# Patient Record
Sex: Male | Born: 1970 | Race: White | Hispanic: No | Marital: Married | Smoking: Never smoker
Health system: Southern US, Community
[De-identification: ages and names within clinical notes are randomized; demographics above are authoritative.]

## PROBLEM LIST (undated history)

## (undated) DIAGNOSIS — E7800 Pure hypercholesterolemia, unspecified: Secondary | ICD-10-CM

---

## 2014-08-15 ENCOUNTER — Encounter (HOSPITAL_COMMUNITY): Payer: Self-pay | Admitting: *Deleted

## 2014-08-15 ENCOUNTER — Inpatient Hospital Stay (HOSPITAL_COMMUNITY): Payer: BLUE CROSS/BLUE SHIELD

## 2014-08-15 ENCOUNTER — Inpatient Hospital Stay (HOSPITAL_COMMUNITY)
Admission: EM | Admit: 2014-08-15 | Discharge: 2014-08-18 | DRG: 603 | Disposition: A | Discharge status: Home / Self Care | Payer: BLUE CROSS/BLUE SHIELD | Attending: Internal Medicine | Admitting: Internal Medicine

## 2014-08-15 DIAGNOSIS — W57XXXA Bitten or stung by nonvenomous insect and other nonvenomous arthropods, initial encounter: Secondary | ICD-10-CM | POA: Diagnosis present

## 2014-08-15 DIAGNOSIS — Z833 Family history of diabetes mellitus: Secondary | ICD-10-CM | POA: Diagnosis not present

## 2014-08-15 DIAGNOSIS — Z791 Long term (current) use of non-steroidal anti-inflammatories (NSAID): Secondary | ICD-10-CM | POA: Diagnosis not present

## 2014-08-15 DIAGNOSIS — L02419 Cutaneous abscess of limb, unspecified: Secondary | ICD-10-CM | POA: Diagnosis present

## 2014-08-15 DIAGNOSIS — L03119 Cellulitis of unspecified part of limb: Secondary | ICD-10-CM | POA: Diagnosis present

## 2014-08-15 DIAGNOSIS — Z23 Encounter for immunization: Secondary | ICD-10-CM

## 2014-08-15 DIAGNOSIS — Z79899 Other long term (current) drug therapy: Secondary | ICD-10-CM

## 2014-08-15 DIAGNOSIS — L0291 Cutaneous abscess, unspecified: Secondary | ICD-10-CM | POA: Insufficient documentation

## 2014-08-15 DIAGNOSIS — L0390 Cellulitis, unspecified: Secondary | ICD-10-CM

## 2014-08-15 DIAGNOSIS — L03115 Cellulitis of right lower limb: Secondary | ICD-10-CM | POA: Diagnosis present

## 2014-08-15 DIAGNOSIS — M7989 Other specified soft tissue disorders: Secondary | ICD-10-CM | POA: Diagnosis present

## 2014-08-15 DIAGNOSIS — L02415 Cutaneous abscess of right lower limb: Secondary | ICD-10-CM | POA: Diagnosis present

## 2014-08-15 LAB — CBC WITH DIFFERENTIAL/PLATELET
Basophils Absolute: 0 10*3/uL (ref 0.0–0.1)
Basophils Relative: 0 % (ref 0–1)
EOS ABS: 0.1 10*3/uL (ref 0.0–0.7)
Eosinophils Relative: 1 % (ref 0–5)
HEMATOCRIT: 45.5 % (ref 39.0–52.0)
HEMOGLOBIN: 14.9 g/dL (ref 13.0–17.0)
LYMPHS ABS: 3 10*3/uL (ref 0.7–4.0)
Lymphocytes Relative: 22 % (ref 12–46)
MCH: 29.9 pg (ref 26.0–34.0)
MCHC: 32.7 g/dL (ref 30.0–36.0)
MCV: 91.2 fL (ref 78.0–100.0)
MONOS PCT: 11 % (ref 3–12)
Monocytes Absolute: 1.5 10*3/uL — ABNORMAL HIGH (ref 0.1–1.0)
NEUTROS PCT: 66 % (ref 43–77)
Neutro Abs: 8.8 10*3/uL — ABNORMAL HIGH (ref 1.7–7.7)
Platelets: 288 10*3/uL (ref 150–400)
RBC: 4.99 MIL/uL (ref 4.22–5.81)
RDW: 13.1 % (ref 11.5–15.5)
WBC: 13.4 10*3/uL — ABNORMAL HIGH (ref 4.0–10.5)

## 2014-08-15 LAB — BASIC METABOLIC PANEL
Anion gap: 8 (ref 5–15)
BUN: 16 mg/dL (ref 6–20)
CO2: 32 mmol/L (ref 22–32)
CREATININE: 1.16 mg/dL (ref 0.61–1.24)
Calcium: 9.6 mg/dL (ref 8.9–10.3)
Chloride: 98 mmol/L — ABNORMAL LOW (ref 101–111)
GFR calc Af Amer: >60 mL/min (ref >60)
GFR calc non Af Amer: >60 mL/min (ref >60)
GLUCOSE: 106 mg/dL — AB (ref 65–99)
Potassium: 3.9 mmol/L (ref 3.5–5.1)
Sodium: 138 mmol/L (ref 135–145)

## 2014-08-15 MED ORDER — ENOXAPARIN SODIUM 40 MG/0.4ML ~~LOC~~ SOLN
40.0000 mg | SUBCUTANEOUS | Status: DC
  Administered 2014-08-15 – 2014-08-17 (×3): 40 mg via SUBCUTANEOUS
  Filled 2014-08-15 (×4): qty 0.4

## 2014-08-15 MED ORDER — ONDANSETRON HCL 4 MG/2ML IJ SOLN: 4.0000 mg | Freq: Three times a day (TID) | INTRAMUSCULAR | Status: DC | PRN

## 2014-08-15 MED ORDER — VANCOMYCIN HCL IN DEXTROSE 1-5 GM/200ML-% IV SOLN
1000.0000 mg | Freq: Three times a day (TID) | INTRAVENOUS | Status: DC
  Administered 2014-08-16 – 2014-08-18 (×7): 1000 mg via INTRAVENOUS
  Filled 2014-08-15 (×9): qty 200

## 2014-08-15 MED ORDER — ACETAMINOPHEN 325 MG PO TABS
650.0000 mg | ORAL_TABLET | Freq: Four times a day (QID) | ORAL | Status: DC | PRN
  Administered 2014-08-15: 650 mg via ORAL
  Filled 2014-08-15: qty 2

## 2014-08-15 MED ORDER — MORPHINE SULFATE 2 MG/ML IJ SOLN: 2.0000 mg | INTRAMUSCULAR | Status: DC | PRN

## 2014-08-15 MED ORDER — SODIUM CHLORIDE 0.9 % IV SOLN
INTRAVENOUS | Status: DC
  Administered 2014-08-15: 1000 mL via INTRAVENOUS

## 2014-08-15 MED ORDER — VANCOMYCIN HCL IN DEXTROSE 1-5 GM/200ML-% IV SOLN
1000.0000 mg | Freq: Once | INTRAVENOUS | Status: AC
  Administered 2014-08-15: 1000 mg via INTRAVENOUS
  Filled 2014-08-15: qty 200

## 2014-08-15 MED ORDER — LIDOCAINE HCL 2 % IJ SOLN
10.0000 mL | Freq: Once | INTRAMUSCULAR | Status: AC
  Administered 2014-08-15: 200 mg
  Filled 2014-08-15: qty 20

## 2014-08-15 MED ORDER — ACETAMINOPHEN 650 MG RE SUPP: 650.0000 mg | Freq: Four times a day (QID) | RECTAL | Status: DC | PRN

## 2014-08-15 MED ORDER — ONDANSETRON HCL 4 MG/2ML IJ SOLN: 4.0000 mg | Freq: Four times a day (QID) | INTRAMUSCULAR | Status: DC | PRN

## 2014-08-15 MED ORDER — HYDROMORPHONE HCL 1 MG/ML IJ SOLN: 1.0000 mg | INTRAMUSCULAR | Status: DC | PRN

## 2014-08-15 MED ORDER — ONDANSETRON HCL 4 MG PO TABS: 4.0000 mg | ORAL_TABLET | Freq: Four times a day (QID) | ORAL | Status: DC | PRN

## 2014-08-15 MED ORDER — HYDROCODONE-ACETAMINOPHEN 5-325 MG PO TABS: 1.0000 | ORAL_TABLET | ORAL | Status: DC | PRN

## 2014-08-15 NOTE — ED Notes (Signed)
Per pt report: pt was bitten by an unknown insect 3 days ago.  Initially the site was a "big whitehead" that the pt popped and attempted to keep the site clean.  Pt reports the site has been getting worse. Site is red, swollen, and painful to the touch.  Pt was a urgent care yesterday and was given an IM shot of Ancef and a prescription for bactrim. Pt repors having a fever and a headache today.

## 2014-08-15 NOTE — ED Notes (Signed)
Pt's insect bite is on the right calve. Pt reports increased pain when ambulating.

## 2014-08-15 NOTE — H&P (Signed)
PCP:  LEE, DAVID Bea Laura, MD not local   Referring provider JOsh   Chief Complaint:  Right leg swelling  HPI: Ethan Matthews is a 44 y.o. male   has no past medical history on file.   Presented with  5-6 days ago he developed a "white head" on the side of his right calf. He used alcohol and tried to pop it. Next day it returned 3 times the size and started to swell. Yesterday patient presented to urgent care and was started on bactrim. He was given Rocephin IM and given prescription for bactrim. He took 3 doses but developed worsening leg swelling erythema and fever up to 100.5 HE presented to ER and was found to have an abscess. Patient had I&D and packing and was given a dose of Vancomycin IV. WBC was up to 13.4 Of note patient drove to [facility] with some breaks. Denies any chest pain or shortness of breath.   Hospitalist was called for admission for Cellulitis and abscess of right leg   Review of Systems:    Pertinent positives include: Fevers, chills, headaches,  nausea,   Constitutional:  No weight loss, night sweats,  fatigue, weight loss  HEENT:  No , Difficulty swallowing,Tooth/dental problems,Sore throat,  No sneezing, itching, ear ache, nasal congestion, post nasal drip,  Cardio-vascular:  No chest pain, Orthopnea, PND, anasarca, dizziness, palpitations.no Bilateral lower extremity swelling  GI:  No heartburn, indigestion, abdominal pain, vomiting, diarrhea, change in bowel habits, loss of appetite, melena, blood in stool, hematemesis Resp:  no shortness of breath at rest. No dyspnea on exertion, No excess mucus, no productive cough, No non-productive cough, No coughing up of blood.No change in color of mucus.No wheezing. Skin:  no rash or lesions. No jaundice GU:  no dysuria, change in color of urine, no urgency or frequency. No straining to urinate.  No flank pain.  Musculoskeletal:  No joint pain or no joint swelling. No decreased range of motion. No back  pain.  Psych:  No change in mood or affect. No depression or anxiety. No memory loss.  Neuro: no localizing neurological complaints, no tingling, no weakness, no double vision, no gait abnormality, no slurred speech, no confusion  Otherwise ROS are negative except for above, 10 systems were reviewed  Past Medical History: History reviewed. No pertinent past medical history. History reviewed. No pertinent past surgical history.   Medications: Prior to Admission medications   Medication Sig Start Date End Date Taking? Authorizing Provider  Multiple Vitamins-Minerals (MULTIVITAMIN MEN PO) Take 1 tablet by mouth daily.   Yes Historical Provider, MD  naproxen (NAPROSYN) 500 MG tablet Take 500 mg by mouth 2 (two) times daily as needed for mild pain or moderate pain.   Yes Historical Provider, MD  sulfamethoxazole-trimethoprim (BACTRIM DS,SEPTRA DS) 800-160 MG per tablet Take 1 tablet by mouth 2 (two) times daily.   Yes Historical Provider, MD    Allergies:  No Known Allergies  Social History:  Ambulatory  independently   Lives at home  With family     reports that he has never smoked. He does not have any smokeless tobacco history on file. He reports that he does not drink alcohol or use illicit drugs.    Family History: family history includes Diabetes type II in his father.    Physical Exam: Patient Vitals for the past 24 hrs:  BP Temp Temp src Pulse Resp SpO2 Height Weight  08/15/14 1755 125/88 mmHg 98.1 F (36.7 C) Oral  73 17 98 % - -  08/15/14 1606 125/90 mmHg - - 72 18 99 % - -  08/15/14 1331 116/81 mmHg 98 F (36.7 C) Oral 78 16 98 % 6\' 4"  (1.93 m) 98.884 kg (218 lb)    1. General:  in No Acute distress 2. Psychological: Alert and   Oriented 3. Head/ENT:     Dry Mucous Membranes                          Head Non traumatic, neck supple                          Normal  Dentition 4. SKIN: normal  Skin turgor,  Skin clean Dry and intact no rash, redness of right lower  extremity, noted few atypical appearing nevi on the back patient was advice to be evaluated by dermatology as an outpatient 5. Heart: Regular rate and rhythm no Murmur, Rub or gallop 6. Lungs: Clear to auscultation bilaterally, no wheezes or crackles   7. Abdomen: Soft, non-tender, Non distended 8. Lower extremities: no clubbing, cyanosis, or edema 9. Neurologically Grossly intact, moving all 4 extremities equally 10. MSK: Normal range of motion  body mass index is 26.55 kg/(m^2).   Labs on Admission:   Results for orders placed or performed during the hospital encounter of 08/15/14 (from the past 24 hour(s))  CBC with Differential/Platelet     Status: Abnormal   Collection Time: 08/15/14  5:06 PM  Result Value Ref Range   WBC 13.4 (H) 4.0 - 10.5 K/uL   RBC 4.99 4.22 - 5.81 MIL/uL   Hemoglobin 14.9 13.0 - 17.0 g/dL   HCT 84.145.5 32.439.0 - 40.152.0 %   MCV 91.2 78.0 - 100.0 fL   MCH 29.9 26.0 - 34.0 pg   MCHC 32.7 30.0 - 36.0 g/dL   RDW 02.713.1 25.311.5 - 66.415.5 %   Platelets 288 150 - 400 K/uL   Neutrophils Relative % 66 43 - 77 %   Neutro Abs 8.8 (H) 1.7 - 7.7 K/uL   Lymphocytes Relative 22 12 - 46 %   Lymphs Abs 3.0 0.7 - 4.0 K/uL   Monocytes Relative 11 3 - 12 %   Monocytes Absolute 1.5 (H) 0.1 - 1.0 K/uL   Eosinophils Relative 1 0 - 5 %   Eosinophils Absolute 0.1 0.0 - 0.7 K/uL   Basophils Relative 0 0 - 1 %   Basophils Absolute 0.0 0.0 - 0.1 K/uL  Basic metabolic panel     Status: Abnormal   Collection Time: 08/15/14  5:06 PM  Result Value Ref Range   Sodium 138 135 - 145 mmol/L   Potassium 3.9 3.5 - 5.1 mmol/L   Chloride 98 (L) 101 - 111 mmol/L   CO2 32 22 - 32 mmol/L   Glucose, Bld 106 (H) 65 - 99 mg/dL   BUN 16 6 - 20 mg/dL   Creatinine, Ser 4.031.16 0.61 - 1.24 mg/dL   Calcium 9.6 8.9 - 47.410.3 mg/dL   GFR calc non Af Amer >60 >60 mL/min   GFR calc Af Amer >60 >60 mL/min   Anion gap 8 5 - 15    UA not obtained  No results found for: HGBA1C  Estimated Creatinine Clearance: 98.7  mL/min (by C-G formula based on Cr of 1.16).  BNP (last 3 results) No results for input(s): PROBNP in the last 8760 hours.  Other results:  I have pearsonaly reviewed this: ECG REPORT not obtained   Filed Weights   08/15/14 1331  Weight: 98.884 kg (218 lb)     Cultures: No results found for: SDES, SPECREQUEST, CULT, REPTSTATUS   Radiological Exams on Admission: No results found.  Chart has been reviewed  Family not at  Bedside    Assessment/Plan  44 year old gentleman with no significant past medical history presents with purulent cellulitis and abscess of right lower extremity  Present on Admission:  . Cellulitis and abscess of leg - admit for IV antibiotics elevated right lower extremity. Obtain plain x-ray to evaluate for any osseous abnormality. Demarcated swelling and erythema more consistent with cellulitis rather than DVT   Prophylaxis:  Lovenox   CODE STATUS:  FULL CODE  as per patient    Disposition:  To home once workup is complete and patient is stable  Other plan as per orders.  I have spent a total of 55 min on this admission  Caterine Mcmeans 08/15/2014, 6:46 PM  [facility]  Pager [phone]   after 2 AM please page floor coverage PA If 7AM-7PM, please contact the day team taking care of the patient  [email or web address]  Password [email or web address]

## 2014-08-15 NOTE — ED Provider Notes (Signed)
CSN: [id number]     Arrival date & time 08/15/14  1254 History   First MD Initiated Contact with Patient 08/15/14 1523     Chief Complaint  Patient presents with  . Insect Bite     (Consider location/radiation/quality/duration/timing/severity/associated sxs/prior Treatment) HPI Comments: Patient presents with gradually worsening abscess and cellulitis to the right calf which he first noted 3 days ago. Patient states that the area started with a "whitehead" that was drained at home. The area has become more red and sore. Patient was seen at an urgent care yesterday and was given a dose of IM Ancef in a prescription for Bactrim. Patient has taken 3 doses of the Bactrim. He states that the area was worse this morning. He states that he had a fever yesterday. He has also had a headache for 2 days. He has taken Tylenol for the headache without relief. Patient denies any recent tick bites. No drainage from the area. Patient denies any history of diabetes or immune compromise. The onset of this condition was acute. The course is constant. Aggravating factors: none. Alleviating factors: none.    The history is provided by the patient.    History reviewed. No pertinent past medical history. History reviewed. No pertinent past surgical history. No family history on file. History  Substance Use Topics  . Smoking status: Never Smoker   . Smokeless tobacco: Not on file  . Alcohol Use: No    Review of Systems  Constitutional: Positive for fever.  HENT: Negative for rhinorrhea and sore throat.   Eyes: Negative for redness.  Respiratory: Negative for cough.   Cardiovascular: Positive for leg swelling. Negative for chest pain.  Gastrointestinal: Negative for nausea, vomiting, abdominal pain and diarrhea.  Genitourinary: Negative for dysuria.  Musculoskeletal: Negative for myalgias.  Skin: Negative for color change and rash.       Positive for abscess  Neurological: Negative for headaches.    Hematological: Negative for adenopathy.      Allergies  Review of patient's allergies indicates no known allergies.  Home Medications   Prior to Admission medications   Medication Sig Start Date End Date Taking? Authorizing Provider  Multiple Vitamins-Minerals (MULTIVITAMIN MEN PO) Take 1 tablet by mouth daily.   Yes Historical Provider, MD  naproxen (NAPROSYN) 500 MG tablet Take 500 mg by mouth 2 (two) times daily as needed for mild pain or moderate pain.   Yes Historical Provider, MD  sulfamethoxazole-trimethoprim (BACTRIM DS,SEPTRA DS) 800-160 MG per tablet Take 1 tablet by mouth 2 (two) times daily.   Yes Historical Provider, MD   BP 116/81 mmHg  Pulse 78  Temp(Src) 98 F (36.7 C) (Oral)  Resp 16  Ht 6\' 4"  (1.93 m)  Wt 218 lb (98.884 kg)  BMI 26.55 kg/m2  SpO2 98%   Physical Exam  Constitutional: He appears well-developed and well-nourished.  HENT:  Head: Normocephalic and atraumatic.  Eyes: Conjunctivae are normal. Right eye exhibits no discharge. Left eye exhibits no discharge.  Neck: Normal range of motion. Neck supple.  Cardiovascular: Normal rate, regular rhythm and normal heart sounds.   Pulmonary/Chest: Effort normal and breath sounds normal.  Abdominal: Soft. There is no tenderness.  Neurological: He is alert.  Skin: Skin is warm and dry. There is erythema.  Patients with a 1-2 cm area of induration to the right medial calf with several centimeters of surrounding cellulitis. No lymphangitis. Area is mildly tender and warm to the touch. No drainage.  Psychiatric: He has a normal  mood and affect.  Nursing note and vitals reviewed.      ED Course  Procedures (including critical care time) Labs Review Labs Reviewed  WOUND CULTURE  CBC WITH DIFFERENTIAL/PLATELET  BASIC METABOLIC PANEL    Imaging Review No results found.   EKG Interpretation None       3:25 PM Patient seen and examined. Work-up initiated. Medications ordered.   Vital signs  reviewed and are as follows: BP 116/81 mmHg  Pulse 78  Temp(Src) 98 F (36.7 C) (Oral)  Resp 16  Ht 6\' 4"  (1.93 m)  Wt 218 lb (98.884 kg)  BMI 26.55 kg/m2  SpO2 98%  EMERGENCY DEPARTMENT US SOFT TISSUE INTERPRETATION "Study: Limited Ultrasound of the noted body part in comments below"  INDICATIONS: Pain and Soft tissue infection Multiple views of the body part are obtained with a multi-frequency linear probe  PERFORMED BY:  Myself  IMAGES ARCHIVED?: Yes  SIDE:Right   BODY PART:Lower extremity  FINDINGS: Abcess present and Cellulitis present  LIMITATIONS:  none  INTERPRETATION:  Abcess present and Cellulitis present  COMMENT:  Small collection of fluid forming 5mm - 10mm in depth   INCISION AND DRAINAGE Performed by: Carolee RotaGEIPLE,Sheriann Newmann S Consent: Verbal consent obtained. Risks and benefits: risks, benefits and alternatives were discussed Type: abscess  Body area: R medial ankle  Anesthesia: local infiltration  Incision was made with a scalpel.  Local anesthetic: lidocaine 2%   Anesthetic total: 3 ml  Complexity: complex Blunt dissection to break up loculations  Drainage: purulent  Drainage amount: moderate  Packing material: 1/4 in iodoform gauze  Patient tolerance: Patient tolerated the procedure well with no immediate complications.  Culture sent given questionable response to antibiotics to this point.    4:56 PM Pending blood count results.   6:09 PM WBC elevated. Patient discussed with and seen by Dr. Freida BusmanAllen. Will admit for IV abx given worsening of condition on appropriate outpatient antibiotics.   MDM   Final diagnoses:  Abscess and cellulitis   Pt with cellulitis and abscess, failure of appropriate outpatient antibiotics.    Renne CriglerJoshua Keahi Mccarney, PA-C 08/15/14 1840

## 2014-08-15 NOTE — Progress Notes (Signed)
ANTIBIOTIC CONSULT NOTE - INITIAL  Pharmacy Consult for Vancomycin Indication: cellulitis with abscess  No Known Allergies  Patient Measurements: Height: 6\' 4"  (193 cm) Weight: 218 lb (98.884 kg) IBW/kg (Calculated) : 86.8  Vital Signs: Temp: 98.1 F (36.7 C) (05/20 1755) Temp Source: Oral (05/20 1755) BP: 125/88 mmHg (05/20 1755) Pulse Rate: 73 (05/20 1755) Intake/Output from previous day:   Intake/Output from this shift:    Labs:  Recent Labs  08/15/14 1706  WBC 13.4*  HGB 14.9  PLT 288  CREATININE 1.16   Estimated Creatinine Clearance: 98.7 mL/min (by C-G formula based on Cr of 1.16). No results for input(s): VANCOTROUGH, VANCOPEAK, VANCORANDOM, GENTTROUGH, GENTPEAK, GENTRANDOM, TOBRATROUGH, TOBRAPEAK, TOBRARND, AMIKACINPEAK, AMIKACINTROU, AMIKACIN in the last 72 hours.   Microbiology: No results found for this or any previous visit (from the past 720 hour(s)).  Medical History: History reviewed. No pertinent past medical history.  Medications:  Scheduled:  . enoxaparin (LOVENOX) injection  40 mg Subcutaneous Q24H  . [START ON 08/16/2014] vancomycin  1,000 mg Intravenous Q8H   PRN: acetaminophen **OR** acetaminophen, HYDROcodone-acetaminophen, HYDROmorphone (DILAUDID) injection, morphine injection, ondansetron **OR** ondansetron (ZOFRAN) IV   Assessment: 44 y.o. male admitted 08/15/2014 for worsening cellulitis of R calf.  Received ceftriaxone x 1 and placed on Bactrim in ED yesterday, but no improvement after 3 doses and returns to ED.  Abscess noted, underwent I&D; to start on vancomycin per pharmacy.  Vancomycin 1g x 1 received in ED already.  5/20 vancomycin >>  Tmax: WBCs: Renal: wnl; CrCl > 90 CG; 81 N  5/20 blood: 5/20 wound Cx:   Drug level / dose changes info:   Goal of Therapy:  Vancomycin trough level 15-20 mcg/ml  Eradication of infection Appropriate antibiotic dosing for indication and renal function  Plan:  Day 1  antibiotics Vancomycin 1000 mg IV q8 hr Measure vancomycin trough levels at steady state as indicated  Follow clinical course, renal function, culture results as available  Follow for de-escalation of antibiotics and LOT   Bernadene Personrew Fabiola Mudgett, PharmD Pager: [phone] 08/15/2014, 8:25 PM

## 2014-08-15 NOTE — ED Provider Notes (Signed)
Medical screening examination/treatment/procedure(s) were conducted as a shared visit with non-physician practitioner(s) and myself.  I personally evaluated the patient during the encounter.   EKG Interpretation None     Pt with right le erythema and abscess, failed outpt abx, abscess drained here, pt will be started on vanc and admitted  Lorre NickAnthony Barbette Mcglaun, MD 08/15/14 1751

## 2014-08-16 DIAGNOSIS — L02419 Cutaneous abscess of limb, unspecified: Secondary | ICD-10-CM

## 2014-08-16 DIAGNOSIS — L03119 Cellulitis of unspecified part of limb: Secondary | ICD-10-CM

## 2014-08-16 LAB — COMPREHENSIVE METABOLIC PANEL
ALBUMIN: 3.7 g/dL (ref 3.5–5.0)
ALK PHOS: 76 U/L (ref 38–126)
ALT: 111 U/L — ABNORMAL HIGH (ref 17–63)
ANION GAP: 6 (ref 5–15)
AST: 70 U/L — ABNORMAL HIGH (ref 15–41)
BILIRUBIN TOTAL: 0.6 mg/dL (ref 0.3–1.2)
BUN: 17 mg/dL (ref 6–20)
CO2: 32 mmol/L (ref 22–32)
CREATININE: 1.13 mg/dL (ref 0.61–1.24)
Calcium: 9.2 mg/dL (ref 8.9–10.3)
Chloride: 101 mmol/L (ref 101–111)
GFR calc Af Amer: >60 mL/min (ref >60)
Glucose, Bld: 99 mg/dL (ref 65–99)
Potassium: 3.8 mmol/L (ref 3.5–5.1)
Sodium: 139 mmol/L (ref 135–145)
TOTAL PROTEIN: 7 g/dL (ref 6.5–8.1)

## 2014-08-16 LAB — PHOSPHORUS: Phosphorus: 4 mg/dL (ref 2.5–4.6)

## 2014-08-16 LAB — CBC
HEMATOCRIT: 40.9 % (ref 39.0–52.0)
Hemoglobin: 13.5 g/dL (ref 13.0–17.0)
MCH: 29.9 pg (ref 26.0–34.0)
MCHC: 33 g/dL (ref 30.0–36.0)
MCV: 90.7 fL (ref 78.0–100.0)
Platelets: 232 10*3/uL (ref 150–400)
RBC: 4.51 MIL/uL (ref 4.22–5.81)
RDW: 13 % (ref 11.5–15.5)
WBC: 10.8 10*3/uL — AB (ref 4.0–10.5)

## 2014-08-16 LAB — TSH: TSH: 2.629 u[IU]/mL (ref 0.350–4.500)

## 2014-08-16 LAB — MAGNESIUM: Magnesium: 1.9 mg/dL (ref 1.7–2.4)

## 2014-08-16 MED ORDER — HYDROCODONE-ACETAMINOPHEN 5-325 MG PO TABS: 1.0000 | ORAL_TABLET | ORAL | Status: DC | PRN

## 2014-08-16 NOTE — Progress Notes (Signed)
Patient Demographics  Ethan Matthews, is a 44 y.o. male, DOB - [birth year]L[address][birth year], KGM:[id number]  Admit date - 08/15/2014   Admitting Physician Therisa DoyneAnastassia Doutova, MD  Outpatient Primary MD for the patient is LEE, DAVID E, MD  LOS - 1   Chief Complaint  Patient presents with  . Insect Bite        Subjective:   Ethan Matthews today has, No headache, No chest pain, No abdominal pain - No Nausea, No new weakness tingling or numbness, No Cough - SOB. Mild right leg pain upon bearing weight.  Assessment & Plan    1. Right leg cellulitis abscess. Status post incision and drainage in the ER, cellulitis much improved after IV vancomycin which will be continued. We will give another 24-48 hours of IV vancomycin as he failed outpatient Bactrim. Follow cultures. If stable discharge in the next 1-2 days based on clinical response. Received tetanus shot few days ago at an urgent care.    Code Status: full  Family Communication: wife  Disposition Plan:Home 1-2 days   Consults none   Procedures none   DVT Prophylaxis  Lovenox   Lab Results  Component Value Date   PLT 232 08/16/2014    Medications  Scheduled Meds: . enoxaparin (LOVENOX) injection  40 mg Subcutaneous Q24H  . vancomycin  1,000 mg Intravenous Q8H   Continuous Infusions:  PRN Meds:.acetaminophen **OR** [DISCONTINUED] acetaminophen, HYDROcodone-acetaminophen, morphine injection, ondansetron **OR** ondansetron (ZOFRAN) IV  Antibiotics    Anti-infectives    Start     Dose/Rate Route Frequency Ordered Stop   08/16/14 0200  vancomycin (VANCOCIN) IVPB 1000 mg/200 mL premix     1,000 mg 200 mL/hr over 60 Minutes Intravenous Every 8 hours 08/15/14 2023     08/15/14 1800  vancomycin (VANCOCIN) IVPB 1000 mg/200 mL premix     1,000  mg 200 mL/hr over 60 Minutes Intravenous  Once 08/15/14 1751 08/15/14 1916        Objective:   Filed Vitals:   08/15/14 2146 08/16/14 0200 08/16/14 0600 08/16/14 1000  BP: 126/74 104/67 114/62 110/69  Pulse: 74 78 57 66  Temp: 98.1 F (36.7 C) 98.7 F (37.1 C) 98.3 F (36.8 C) 97.7 F (36.5 C)  TempSrc: Oral Oral Oral Oral  Resp: 18 18 18 16   Height:      Weight:      SpO2: 100% 94% 100% 99%    Wt Readings from Last 3 Encounters:  08/15/14 98.884 kg (218 lb)     Intake/Output Summary (Last 24 hours) at 08/16/14 1105 Last data filed at 08/16/14 1030  Gross per 24 hour  Intake 1317.5 ml  Output   2075 ml  Net -757.5 ml     Physical Exam  Awake Alert, Oriented X 3, No new F.N deficits, Normal affect [place].AT,PERRAL Supple Neck,No JVD, No cervical lymphadenopathy appriciated.  Symmetrical Chest wall movement, Good air movement bilaterally, CTAB RRR,No Gallops,Rubs or new Murmurs, No Parasternal Heave +ve B.Sounds, Abd Soft, No tenderness, No organomegaly appriciated, No rebound - guarding or rigidity. No Cyanosis, Clubbing or edema, No new Rash or bruise except Right leg in the mid tibial region redness and cellulitis noted, redness is receding the marked margins. No fluctuance.   Data Review  Micro Results Recent Results (from the past 240 hour(s))  Wound culture     Status: None (Preliminary result)   Collection Time: 08/15/14  4:45 PM  Result Value Ref Range Status   Specimen Description WOUND RIGHT LEG  Final   Special Requests Normal  Final   Gram Stain   Final    RARE WBC PRESENT, PREDOMINANTLY MONONUCLEAR NO SQUAMOUS EPITHELIAL CELLS SEEN RARE GRAM POSITIVE COCCI IN PAIRS IN CLUSTERS Performed at [facility]    Culture PENDING  Incomplete   Report Status PENDING  Incomplete    Radiology Reports Dg Tibia/fibula Right  08/15/2014   CLINICAL DATA:  Per pt report: pt was bitten by an unknown insect 3 days ago. Initially the site was a "big  whitehead" that the pt popped and attempted to keep the site clean. Pt then traveled to [place], [place] and reports that he did get in various bodies of water. Pt reports the site has been getting worse. Site is red, swollen, and painful to the touch. Pt was a urgent care yesterday and was given an IM shot of Ancef and a prescription. Pt reports having a fever and a headache today.  EXAM: RIGHT TIBIA AND FIBULA - 2 VIEW  COMPARISON:  None.  FINDINGS: Normal anatomic alignment. No evidence for acute fracture dislocation. Regional soft tissues are grossly unremarkable.  IMPRESSION: No acute osseous abnormality.   Electronically Signed   By: Annia Belt M.D.   On: 08/15/2014 19:31     CBC  Recent Labs Lab 08/15/14 1706 08/16/14 0435  WBC 13.4* 10.8*  HGB 14.9 13.5  HCT 45.5 40.9  PLT 288 232  MCV 91.2 90.7  MCH 29.9 29.9  MCHC 32.7 33.0  RDW 13.1 13.0  LYMPHSABS 3.0  --   MONOABS 1.5*  --   EOSABS 0.1  --   BASOSABS 0.0  --     Chemistries   Recent Labs Lab 08/15/14 1706 08/16/14 0435  NA 138 139  K 3.9 3.8  CL 98* 101  CO2 32 32  GLUCOSE 106* 99  BUN 16 17  CREATININE 1.16 1.13  CALCIUM 9.6 9.2  MG  --  1.9  AST  --  70*  ALT  --  111*  ALKPHOS  --  76  BILITOT  --  0.6   ------------------------------------------------------------------------------------------------------------------ estimated creatinine clearance is 101.4 mL/min (by C-G formula based on Cr of 1.13). ------------------------------------------------------------------------------------------------------------------ No results for input(s): HGBA1C in the last 72 hours. ------------------------------------------------------------------------------------------------------------------ No results for input(s): CHOL, HDL, LDLCALC, TRIG, CHOLHDL, LDLDIRECT in the last 72 hours. ------------------------------------------------------------------------------------------------------------------  Recent Labs   08/16/14 0435  TSH 2.629   ------------------------------------------------------------------------------------------------------------------ No results for input(s): VITAMINB12, FOLATE, FERRITIN, TIBC, IRON, RETICCTPCT in the last 72 hours.  Coagulation profile No results for input(s): INR, PROTIME in the last 168 hours.  No results for input(s): DDIMER in the last 72 hours.  Cardiac Enzymes No results for input(s): CKMB, TROPONINI, MYOGLOBIN in the last 168 hours.  Invalid input(s): CK ------------------------------------------------------------------------------------------------------------------ Invalid input(s): POCBNP   Time Spent in minutes  35   SINGH,PRASHANT K M.D on 08/16/2014 at 11:05 AM  Between 7am to 7pm - Pager - [phone]  After 7pm go to [email or web address] - password [facility]  [facility]   Office  [phone]

## 2014-08-17 LAB — WOUND CULTURE: SPECIAL REQUESTS: NORMAL

## 2014-08-17 NOTE — Progress Notes (Signed)
Patient Demographics  Ethan Matthews, is a 44 y.o. male, DOB - 09/15/1970, ZOX:[id number]  Admit date - 08/15/2014   Admitting Physician Therisa DoyneAnastassia Doutova, MD  Outpatient Primary MD for the patient is LEE, DAVID E, MD  LOS - 2   Chief Complaint  Patient presents with  . Insect Bite        Subjective:   Ethan Matthews today has, No headache, No chest pain, No abdominal pain - No Nausea, No new weakness tingling or numbness, No Cough - SOB. Mild right leg pain upon bearing weight.  Assessment & Plan    1. Right leg cellulitis abscess. Status post incision and drainage in the ER, cellulitis much improved after IV vancomycin which will be continued. We will give another 24 hours of IV vancomycin as he failed outpatient Bactrim. Follow cultures. If stable discharge home today based on clinical response. Received tetanus shot few days ago at an urgent care.    Code Status: full  Family Communication: wife  Disposition Plan:Home 1 day   Consults none   Procedures none   DVT Prophylaxis  Lovenox   Lab Results  Component Value Date   PLT 232 08/16/2014    Medications  Scheduled Meds: . enoxaparin (LOVENOX) injection  40 mg Subcutaneous Q24H  . vancomycin  1,000 mg Intravenous Q8H   Continuous Infusions:  PRN Meds:.acetaminophen **OR** [DISCONTINUED] acetaminophen, HYDROcodone-acetaminophen, morphine injection, ondansetron **OR** ondansetron (ZOFRAN) IV  Antibiotics    Anti-infectives    Start     Dose/Rate Route Frequency Ordered Stop   08/16/14 0200  vancomycin (VANCOCIN) IVPB 1000 mg/200 mL premix     1,000 mg 200 mL/hr over 60 Minutes Intravenous Every 8 hours 08/15/14 2023     08/15/14 1800  vancomycin (VANCOCIN) IVPB 1000 mg/200 mL premix     1,000 mg 200 mL/hr over  60 Minutes Intravenous  Once 08/15/14 1751 08/15/14 1916        Objective:   Filed Vitals:   08/16/14 1000 08/16/14 1700 08/16/14 2200 08/17/14 0507  BP: 110/69 115/65 123/83 108/61  Pulse: 66 65 64 65  Temp: 97.7 F (36.5 C) 97.7 F (36.5 C) 97.5 F (36.4 C) 97.9 F (36.6 C)  TempSrc: Oral Oral Oral Oral  Resp: 16 16 18 18   Height:      Weight:      SpO2: 99% 99% 100% 98%    Wt Readings from Last 3 Encounters:  08/15/14 98.884 kg (218 lb)     Intake/Output Summary (Last 24 hours) at 08/17/14 1004 Last data filed at 08/17/14 0943  Gross per 24 hour  Intake   1080 ml  Output   4825 ml  Net  -3745 ml     Physical Exam  Awake Alert, Oriented X 3, No new F.N deficits, Normal affect [place].AT,PERRAL Supple Neck,No JVD, No cervical lymphadenopathy appriciated.  Symmetrical Chest wall movement, Good air movement bilaterally, CTAB RRR,No Gallops,Rubs or new Murmurs, No Parasternal Heave +ve B.Sounds, Abd Soft, No tenderness, No organomegaly appriciated, No rebound - guarding or rigidity. No Cyanosis, Clubbing or edema, No new Rash or bruise except Right leg in the mid tibial region redness and cellulitis noted, redness is receding the marked margins. No fluctuance.   Data Review  Micro Results Recent Results (from the past 240 hour(s))  Wound culture     Status: None (Preliminary result)   Collection Time: 08/15/14  4:45 PM  Result Value Ref Range Status   Specimen Description WOUND RIGHT LEG  Final   Special Requests Normal  Final   Gram Stain   Final    RARE WBC PRESENT, PREDOMINANTLY MONONUCLEAR NO SQUAMOUS EPITHELIAL CELLS SEEN RARE GRAM POSITIVE COCCI IN PAIRS IN CLUSTERS Performed at [facility]    Culture   Final    ABUNDANT STAPHYLOCOCCUS AUREUS Note: RIFAMPIN AND GENTAMICIN SHOULD NOT BE USED AS SINGLE DRUGS FOR TREATMENT OF STAPH INFECTIONS. Performed at [facility]    Report Status PENDING  Incomplete    Radiology Reports Dg  Tibia/fibula Right  08/15/2014   CLINICAL DATA:  Per pt report: pt was bitten by an unknown insect 3 days ago. Initially the site was a "big whitehead" that the pt popped and attempted to keep the site clean. Pt then traveled to [place], [place] and reports that he did get in various bodies of water. Pt reports the site has been getting worse. Site is red, swollen, and painful to the touch. Pt was a urgent care yesterday and was given an IM shot of Ancef and a prescription. Pt reports having a fever and a headache today.  EXAM: RIGHT TIBIA AND FIBULA - 2 VIEW  COMPARISON:  None.  FINDINGS: Normal anatomic alignment. No evidence for acute fracture dislocation. Regional soft tissues are grossly unremarkable.  IMPRESSION: No acute osseous abnormality.   Electronically Signed   By: Annia Belt M.D.   On: 08/15/2014 19:31     CBC  Recent Labs Lab 08/15/14 1706 08/16/14 0435  WBC 13.4* 10.8*  HGB 14.9 13.5  HCT 45.5 40.9  PLT 288 232  MCV 91.2 90.7  MCH 29.9 29.9  MCHC 32.7 33.0  RDW 13.1 13.0  LYMPHSABS 3.0  --   MONOABS 1.5*  --   EOSABS 0.1  --   BASOSABS 0.0  --     Chemistries   Recent Labs Lab 08/15/14 1706 08/16/14 0435  NA 138 139  K 3.9 3.8  CL 98* 101  CO2 32 32  GLUCOSE 106* 99  BUN 16 17  CREATININE 1.16 1.13  CALCIUM 9.6 9.2  MG  --  1.9  AST  --  70*  ALT  --  111*  ALKPHOS  --  76  BILITOT  --  0.6   ------------------------------------------------------------------------------------------------------------------ estimated creatinine clearance is 101.4 mL/min (by C-G formula based on Cr of 1.13). ------------------------------------------------------------------------------------------------------------------ No results for input(s): HGBA1C in the last 72 hours. ------------------------------------------------------------------------------------------------------------------ No results for input(s): CHOL, HDL, LDLCALC, TRIG, CHOLHDL, LDLDIRECT in the last 72  hours. ------------------------------------------------------------------------------------------------------------------  Recent Labs  08/16/14 0435  TSH 2.629   ------------------------------------------------------------------------------------------------------------------ No results for input(s): VITAMINB12, FOLATE, FERRITIN, TIBC, IRON, RETICCTPCT in the last 72 hours.  Coagulation profile No results for input(s): INR, PROTIME in the last 168 hours.  No results for input(s): DDIMER in the last 72 hours.  Cardiac Enzymes No results for input(s): CKMB, TROPONINI, MYOGLOBIN in the last 168 hours.  Invalid input(s): CK ------------------------------------------------------------------------------------------------------------------ Invalid input(s): POCBNP   Time Spent in minutes  35   Anyi Fels K M.D on 08/17/2014 at 10:04 AM  Between 7am to 7pm - Pager - [phone]  After 7pm go to [email or web address] - password [facility]  [facility]   Office  [phone]

## 2014-08-17 NOTE — Progress Notes (Signed)
Wife in with pt. Denies any pain. Leg cleaned with soap, wound cleaned with normal saline. Small amount purelent drainage on old dsg. DSD reapplied. Pt to be discharged 5/23.

## 2014-08-18 MED ORDER — SULFAMETHOXAZOLE-TRIMETHOPRIM 800-160 MG PO TABS: 1.0000 | ORAL_TABLET | Freq: Two times a day (BID) | ORAL | Status: DC

## 2014-08-18 NOTE — Discharge Summary (Signed)
Ethan Matthews, is a 44 y.o. male  DOB 05/29/1970  MRN [id number].  Admission date:  08/15/2014  Admitting Physician  Therisa Doyne, MD  Discharge Date:  08/18/2014   Primary MD  LEE, Estelle Grumbles, MD  Recommendations for primary care physician for things to follow:    Monitor right leg wound closely, repeat CBC, BMP in a week.   Admission Diagnosis  Abscess [L02.91] Abscess and cellulitis [L03.90, L02.91]   Discharge Diagnosis  Abscess [L02.91] Abscess and cellulitis [L03.90, L02.91]     Principal Problem:   Cellulitis and abscess of leg      History reviewed. No pertinent past medical history.  History reviewed. No pertinent past surgical history.     History of present illness and  Hospital Course:     Kindly see H&P for history of present illness and admission details, please review complete Labs, Consult reports and Test reports for all details in brief  HPI  from the history and physical done on the day of admission  Ethan Matthews is a 44 y.o. male has no past medical history on file.   Presented with 5-6 days ago he developed a "white head" on the side of his right calf. He used alcohol and tried to pop it. Next day it returned 3 times the size and started to swell. Yesterday patient presented to urgent care and was started on bactrim. He was given Rocephin IM and given prescription for bactrim. He took 3 doses but developed worsening leg swelling erythema and fever up to 100.5 HE presented to ER and was found to have an abscess. Patient had I&D and packing and was given a dose of Vancomycin IV. WBC was up to 13.4. Of note patient drove to [facility] with some breaks. Denies any chest pain or shortness of breath.   Hospitalist was called for admission for Cellulitis and abscess of right leg     Hospital Course   1. Right leg cellulitis abscess. Status post incision and drainage in the ER. Received tetanus shot few days ago at an urgent care, he was kept on IV vancomycin for 3 days with excellent results, his cellulitis has almost completely resolved, no fluctuance or pus draining from the incision and drainage site. He will be placed on 7 more days of over Bactrim and sent home.      Discharge Condition: Stable   Follow UP  Follow-up Information    Follow up with LEE, DAVID E, MD. Schedule an appointment as soon as possible for a visit in 3 days.   Specialty:  Family Medicine   Contact information:   [address] [place] [address] [phone]         Discharge Instructions  and  Discharge Medications      Discharge Instructions    Diet - low sodium heart healthy    Complete by:  As directed      Discharge instructions    Complete by:  As directed   Follow with Primary MD  LEE, DAVID E, MD in 7 days , Keep your R.Leg wound clean and dry for 10 days  Get CBC, CMP, 2 view Chest X ray checked  by Primary MD next visit.    Activity: As tolerated with Full fall precautions use walker/cane & assistance as needed   Disposition Home     Diet: Heart Healthy    For Heart failure patients - Check your Weight same time everyday, if you gain over 2 pounds, or you develop in leg swelling, experience more shortness of breath or chest pain, call your Primary MD immediately. Follow Cardiac Low Salt Diet and 1.5 lit/day fluid restriction.   On your next visit with your primary care physician please Get Medicines reviewed and adjusted.   Please request your Prim.MD to go over all Hospital Tests and Procedure/Radiological results at the follow up, please get all Hospital records sent to your Prim MD by signing hospital release before you go home.   If you experience worsening of your admission symptoms, develop shortness of breath, life threatening  emergency, suicidal or homicidal thoughts you must seek medical attention immediately by calling 911 or calling your MD immediately  if symptoms less severe.  You Must read complete instructions/literature along with all the possible adverse reactions/side effects for all the Medicines you take and that have been prescribed to you. Take any new Medicines after you have completely understood and accpet all the possible adverse reactions/side effects.   Do not drive, operating heavy machinery, perform activities at heights, swimming or participation in water activities or provide baby sitting services if your were admitted for syncope or siezures until you have seen by Primary MD or a Neurologist and advised to do so again.  Do not drive when taking Pain medications.    Do not take more than prescribed Pain, Sleep and Anxiety Medications  Special Instructions: If you have smoked or chewed Tobacco  in the last 2 yrs please stop smoking, stop any regular Alcohol  and or any Recreational drug use.  Wear Seat belts while driving.   Please note  You were cared for by a hospitalist during your hospital stay. If you have any questions about your discharge medications or the care you received while you were in the hospital after you are discharged, you can call the unit and asked to speak with the hospitalist on call if the hospitalist that took care of you is not available. Once you are discharged, your primary care physician will handle any further medical issues. Please note that NO REFILLS for any discharge medications will be authorized once you are discharged, as it is imperative that you return to your primary care physician (or establish a relationship with a primary care physician if you do not have one) for your aftercare needs so that they can reassess your need for medications and monitor your lab values.                                                      Ethan Matthews was admitted to  the Hospital on 08/15/2014 and Discharged  08/18/2014 and should be excused from work/school   for 10 days starting 08/15/2014 , may return to work/school without any restrictions.  Call Susa RaringPrashant Singh MD, [facility]  [phone] with questions.  Leroy SeaSINGH,Ethan K M.D on 08/18/2014,at 8:19  AM  [facility]   Office  [phone]     Increase activity slowly    Complete by:  As directed             Medication List    TAKE these medications        MULTIVITAMIN MEN PO  Take 1 tablet by mouth daily.     naproxen 500 MG tablet  Commonly known as:  NAPROSYN  Take 500 mg by mouth 2 (two) times daily as needed for mild pain or moderate pain.     sulfamethoxazole-trimethoprim 800-160 MG per tablet  Commonly known as:  BACTRIM DS,SEPTRA DS  Take 1 tablet by mouth 2 (two) times daily.          Diet and Activity recommendation: See Discharge Instructions above   Consults obtained - None   Major procedures and Radiology Reports - PLEASE review detailed and final reports for all details, in brief -   Incision and drainage in the ER of the right leg abscess   Dg Tibia/fibula Right  08/15/2014   CLINICAL DATA:  Per pt report: pt was bitten by an unknown insect 3 days ago. Initially the site was a "big whitehead" that the pt popped and attempted to keep the site clean. Pt then traveled to [place], [place] and reports that he did get in various bodies of water. Pt reports the site has been getting worse. Site is red, swollen, and painful to the touch. Pt was a urgent care yesterday and was given an IM shot of Ancef and a prescription. Pt reports having a fever and a headache today.  EXAM: RIGHT TIBIA AND FIBULA - 2 VIEW  COMPARISON:  None.  FINDINGS: Normal anatomic alignment. No evidence for acute fracture dislocation. Regional soft tissues are grossly unremarkable.  IMPRESSION: No acute osseous abnormality.   Electronically Signed   By: Annia Belt M.D.   On: 08/15/2014 19:31     Micro Results      Recent Results (from the past 240 hour(s))  Wound culture     Status: None   Collection Time: 08/15/14  4:45 PM  Result Value Ref Range Status   Specimen Description WOUND RIGHT LEG  Final   Special Requests Normal  Final   Gram Stain   Final    RARE WBC PRESENT, PREDOMINANTLY MONONUCLEAR NO SQUAMOUS EPITHELIAL CELLS SEEN RARE GRAM POSITIVE COCCI IN PAIRS IN CLUSTERS Performed at [facility]    Culture   Final    ABUNDANT METHICILLIN RESISTANT STAPHYLOCOCCUS AUREUS Note: RIFAMPIN AND GENTAMICIN SHOULD NOT BE USED AS SINGLE DRUGS FOR TREATMENT OF STAPH INFECTIONS. This organism DOES NOT demonstrate inducible Clindamycin resistance in vitro. CRITICAL RESULT CALLED TO, READ BACK BY AND VERIFIED WITH: CINDY  ON  08/17/14 BY KENDR Performed at [facility]    Report Status 08/17/2014 FINAL  Final   Organism ID, Bacteria METHICILLIN RESISTANT STAPHYLOCOCCUS AUREUS  Final      Susceptibility   Methicillin resistant staphylococcus aureus - MIC*    CLINDAMYCIN <=0.25 SENSITIVE Sensitive     ERYTHROMYCIN >=8 RESISTANT Resistant     GENTAMICIN <=0.5 SENSITIVE Sensitive     LEVOFLOXACIN 4 INTERMEDIATE Intermediate     OXACILLIN >=4 RESISTANT Resistant     PENICILLIN >=0.5 RESISTANT Resistant     RIFAMPIN <=0.5 SENSITIVE Sensitive     TRIMETH/SULFA <=10 SENSITIVE Sensitive     VANCOMYCIN <=0.5 SENSITIVE Sensitive     TETRACYCLINE <=1 SENSITIVE Sensitive     *  ABUNDANT METHICILLIN RESISTANT STAPHYLOCOCCUS AUREUS       Today   Subjective:   Ethan Matthews today has no headache,no chest abdominal pain,no new weakness tingling or numbness, feels much better wants to go home today.    Objective:   Blood pressure 97/55, pulse 66, temperature 98.2 F (36.8 C), temperature source Oral, resp. rate 16, height  (1.93 m), weight 98.884 kg (218 lb), SpO2 98 %.   Intake/Output Summary (Last 24 hours) at 08/18/14 0820 Last data filed at  08/18/14 0600  Gross per 24 hour  Intake   1077 ml  Output   2500 ml  Net  -1423 ml    Exam Awake Alert, Oriented x 3, No new F.N deficits, Normal affect [place].AT,PERRAL Supple Neck,No JVD, No cervical lymphadenopathy appriciated.  Symmetrical Chest wall movement, Good air movement bilaterally, CTAB RRR,No Gallops,Rubs or new Murmurs, No Parasternal Heave +ve B.Sounds, Abd Soft, Non tender, No organomegaly appriciated, No rebound -guarding or rigidity. No Cyanosis, Clubbing or edema, No new Rash or bruise, right leg cellulitis almost completely resolved, no pus being extruded from the incision and drainage site.    Data Review   CBC w Diff: Lab Results  Component Value Date   WBC 10.8* 08/16/2014   HGB 13.5 08/16/2014   HCT 40.9 08/16/2014   PLT 232 08/16/2014   LYMPHOPCT 22 08/15/2014   MONOPCT 11 08/15/2014   EOSPCT 1 08/15/2014   BASOPCT 0 08/15/2014    CMP: Lab Results  Component Value Date   NA 139 08/16/2014   K 3.8 08/16/2014   CL 101 08/16/2014   CO2 32 08/16/2014   BUN 17 08/16/2014   CREATININE 1.13 08/16/2014   PROT 7.0 08/16/2014   ALBUMIN 3.7 08/16/2014   BILITOT 0.6 08/16/2014   ALKPHOS 76 08/16/2014   AST 70* 08/16/2014   ALT 111* 08/16/2014  .   Total Time in preparing paper work, data evaluation and todays exam - 35 minutes  Leroy Sea M.D on 08/18/2014 at 8:20 AM  [facility]   Office  [phone]

## 2014-08-18 NOTE — Care Management Note (Signed)
Case Management Note  Patient Details  Name: Londell MohDaniel Fricker MRN: [id number] Date of Birth: 11/12/1970  Subjective/Objective:       Admitted with cellulitis, failed OP rx               Action/Plan: Discharge planning  Expected Discharge Date:  08/17/14               Expected Discharge Plan:  Home/Self Care  In-House Referral:     Discharge planning Services  CM Consult  Status of Service:  Completed, signed off  Medicare Important Message Given:    Date Medicare IM Given:    Medicare IM give by:    Date Additional Medicare IM Given:    Additional Medicare Important Message give by:     If discussed at Long Length of Stay Meetings, dates discussed:    Additional Comments:  Alexis Goodelleele, Birdia Jaycox K, RN 08/18/2014, 10:26 AM

## 2014-08-18 NOTE — Discharge Instructions (Signed)
Follow with Primary MD LEE, DAVID E, MD in 7 days , Keep your R.Leg wound clean and dry for 10 days  Get CBC, CMP, 2 view Chest X ray checked  by Primary MD next visit.    Activity: As tolerated with Full fall precautions use walker/cane & assistance as needed   Disposition Home     Diet: Heart Healthy    For Heart failure patients - Check your Weight same time everyday, if you gain over 2 pounds, or you develop in leg swelling, experience more shortness of breath or chest pain, call your Primary MD immediately. Follow Cardiac Low Salt Diet and 1.5 lit/day fluid restriction.   On your next visit with your primary care physician please Get Medicines reviewed and adjusted.   Please request your Prim.MD to go over all Hospital Tests and Procedure/Radiological results at the follow up, please get all Hospital records sent to your Prim MD by signing hospital release before you go home.   If you experience worsening of your admission symptoms, develop shortness of breath, life threatening emergency, suicidal or homicidal thoughts you must seek medical attention immediately by calling 911 or calling your MD immediately  if symptoms less severe.  You Must read complete instructions/literature along with all the possible adverse reactions/side effects for all the Medicines you take and that have been prescribed to you. Take any new Medicines after you have completely understood and accpet all the possible adverse reactions/side effects.   Do not drive, operating heavy machinery, perform activities at heights, swimming or participation in water activities or provide baby sitting services if your were admitted for syncope or siezures until you have seen by Primary MD or a Neurologist and advised to do so again.  Do not drive when taking Pain medications.    Do not take more than prescribed Pain, Sleep and Anxiety Medications  Special Instructions: If you have smoked or chewed Tobacco  in the  last 2 yrs please stop smoking, stop any regular Alcohol  and or any Recreational drug use.  Wear Seat belts while driving.   Please note  You were cared for by a hospitalist during your hospital stay. If you have any questions about your discharge medications or the care you received while you were in the hospital after you are discharged, you can call the unit and asked to speak with the hospitalist on call if the hospitalist that took care of you is not available. Once you are discharged, your primary care physician will handle any further medical issues. Please note that NO REFILLS for any discharge medications will be authorized once you are discharged, as it is imperative that you return to your primary care physician (or establish a relationship with a primary care physician if you do not have one) for your aftercare needs so that they can reassess your need for medications and monitor your lab values.                                                      Ethan Matthews was admitted to the Hospital on 08/15/2014 and Discharged  08/18/2014 and should be excused from work/school   for 10 days starting 08/15/2014 , may return to work/school without any restrictions.  Call Susa RaringPrashant Osceola Depaz MD, [facility]  [phone] with questions.  Pa Tennant K M.D  on 08/18/2014,at 8:19 AM  [facility]   Office  [phone]

## 2015-01-11 ENCOUNTER — Emergency Department (HOSPITAL_COMMUNITY)
Admission: EM | Admit: 2015-01-11 | Discharge: 2015-01-12 | Disposition: A | Discharge status: Home / Self Care | Payer: BLUE CROSS/BLUE SHIELD | Attending: Emergency Medicine | Admitting: Emergency Medicine

## 2015-01-11 ENCOUNTER — Encounter (HOSPITAL_COMMUNITY): Payer: Self-pay | Admitting: Oncology

## 2015-01-11 DIAGNOSIS — Z79899 Other long term (current) drug therapy: Secondary | ICD-10-CM | POA: Insufficient documentation

## 2015-01-11 DIAGNOSIS — L02419 Cutaneous abscess of limb, unspecified: Secondary | ICD-10-CM

## 2015-01-11 DIAGNOSIS — L03119 Cellulitis of unspecified part of limb: Secondary | ICD-10-CM

## 2015-01-11 DIAGNOSIS — L02416 Cutaneous abscess of left lower limb: Secondary | ICD-10-CM | POA: Diagnosis not present

## 2015-01-11 DIAGNOSIS — L03116 Cellulitis of left lower limb: Secondary | ICD-10-CM | POA: Diagnosis present

## 2015-01-11 MED ORDER — KETOROLAC TROMETHAMINE 30 MG/ML IJ SOLN
30.0000 mg | Freq: Once | INTRAMUSCULAR | Status: AC
  Administered 2015-01-11: 30 mg via INTRAVENOUS
  Filled 2015-01-11: qty 1

## 2015-01-11 MED ORDER — CLINDAMYCIN PHOSPHATE 600 MG/50ML IV SOLN
600.0000 mg | Freq: Once | INTRAVENOUS | Status: AC
  Administered 2015-01-11: 600 mg via INTRAVENOUS
  Filled 2015-01-11: qty 50

## 2015-01-11 MED ORDER — SODIUM CHLORIDE 0.9 % IV BOLUS (SEPSIS)
500.0000 mL | Freq: Once | INTRAVENOUS | Status: AC
  Administered 2015-01-11: 500 mL via INTRAVENOUS

## 2015-01-11 NOTE — ED Provider Notes (Signed)
CSN: [id number]     Arrival date & time 01/11/15  2038 History  By signing my name below, I, Marica Otterusrat Rahman, attest that this documentation has been prepared under the direction and in the presence of Adekunle Rohrbach, MD. Electronically Signed: Marica OtterNusrat Rahman, ED Scribe. 01/11/2015. 11:24 PM.  Chief Complaint  Patient presents with  . Cellulitis   Patient is a 44 y.o. male presenting with rash. The history is provided by the patient. No language interpreter was used.  Rash Location:  Leg Leg rash location:  L leg Quality: redness   Quality: not bruising   Severity:  Moderate Onset quality:  Gradual Timing:  Constant Progression:  Worsening Chronicity:  Recurrent Context: not animal contact   Relieved by:  Nothing Worsened by:  Nothing tried Ineffective treatments:  None tried Associated symptoms: no diarrhea, no fever, no headaches, no nausea and not vomiting    PCP: LEE, DAVID E, MD HPI Comments: Ethan MohDaniel Callejo is a 44 y.o. male, with PMHx of cellulitis, who presents to the Emergency Department complaining of cellulitis to LUE onset three days ago which started with an infected hair follicle. Associated Sx include fever. Pt denies n/v/d. Pt reports applying peroxide and topical ointment without relief. Pt also reports taking ibuprofen at home with last dose at 5pm today. Pt reports he is UTD on his tetanus vaccine.    History reviewed. No pertinent past medical history. History reviewed. No pertinent past surgical history. Family History  Problem Relation Age of Onset  . Diabetes type II Father    Social History  Substance Use Topics  . Smoking status: Never Smoker   . Smokeless tobacco: None  . Alcohol Use: No    Review of Systems  Constitutional: Negative for fever and diaphoresis.  Gastrointestinal: Negative for nausea, vomiting and diarrhea.  Skin: Positive for color change (cellulitis to LUE) and rash.  Neurological: Negative for headaches.  All other systems reviewed  and are negative.  Allergies  Review of patient's allergies indicates no known allergies.  Home Medications   Prior to Admission medications   Medication Sig Start Date End Date Taking? Authorizing Provider  ibuprofen (ADVIL,MOTRIN) 200 MG tablet Take 400 mg by mouth every 6 (six) hours as needed for moderate pain.   Yes Historical Provider, MD  Multiple Vitamins-Minerals (MULTIVITAMIN MEN PO) Take 1 tablet by mouth daily.   Yes Historical Provider, MD  sulfamethoxazole-trimethoprim (BACTRIM DS,SEPTRA DS) 800-160 MG per tablet Take 1 tablet by mouth 2 (two) times daily. Patient not taking: Reported on 01/11/2015 08/18/14   Leroy SeaPrashant K Singh, MD   Triage Vitals: BP 135/82 mmHg  Pulse 71  Temp(Src) 98.4 F (36.9 C) (Oral)  Resp 16  Ht 6\' 3"  (1.905 m)  Wt 221 lb (100.245 kg)  BMI 27.62 kg/m2  SpO2 97% Physical Exam  Constitutional: He is oriented to person, place, and time. He appears well-developed and well-nourished. No distress.  HENT:  Head: Normocephalic and atraumatic.  Mouth/Throat: Oropharynx is clear and moist and mucous membranes are normal. No oropharyngeal exudate.  Eyes: EOM are normal. Pupils are equal, round, and reactive to light.  Neck: Normal range of motion. Neck supple. No JVD present.  Cardiovascular: Normal rate, regular rhythm, normal heart sounds and intact distal pulses.   Pulmonary/Chest: Effort normal and breath sounds normal. No respiratory distress. He has no wheezes. He has no rales.  Abdominal: Soft. Bowel sounds are normal. He exhibits no distension. There is no tenderness. There is no rebound and no  guarding.  Musculoskeletal: Normal range of motion. He exhibits no edema or tenderness.  Neurological: He is alert and oriented to person, place, and time. He has normal reflexes.  Skin: Skin is warm and dry. He is not diaphoretic.  3x2.75 inches on anterior thigh with head in center, warmth and erythema consistent with cellulitis, but, no fluctuance  suggesting an abscess.   Psychiatric: He has a normal mood and affect. Judgment normal.  Nursing note and vitals reviewed.   ED Course  Procedures (including critical care time) DIAGNOSTIC STUDIES: Oxygen Saturation is 97% on ra, nl by my interpretation.    COORDINATION OF CARE: 11:10 PM: Discussed treatment plan which includes antibiotics (oral and topical) with pt at bedside; patient verbalizes understanding and agrees with treatment plan.  MDM   Final diagnoses:  None    Results for orders placed or performed during the hospital encounter of 01/11/15  CBC with Differential/Platelet  Result Value Ref Range   WBC 11.1 (H) 4.0 - 10.5 K/uL   RBC 4.82 4.22 - 5.81 MIL/uL   Hemoglobin 14.5 13.0 - 17.0 g/dL   HCT 16.1 09.6 - 04.5 %   MCV 90.2 78.0 - 100.0 fL   MCH 30.1 26.0 - 34.0 pg   MCHC 33.3 30.0 - 36.0 g/dL   RDW 40.9 81.1 - 91.4 %   Platelets 253 150 - 400 K/uL   Neutrophils Relative % 56 %   Neutro Abs 6.2 1.7 - 7.7 K/uL   Lymphocytes Relative 30 %   Lymphs Abs 3.3 0.7 - 4.0 K/uL   Monocytes Relative 12 %   Monocytes Absolute 1.3 (H) 0.1 - 1.0 K/uL   Eosinophils Relative 2 %   Eosinophils Absolute 0.3 0.0 - 0.7 K/uL   Basophils Relative 0 %   Basophils Absolute 0.0 0.0 - 0.1 K/uL  I-Stat Chem 8, ED  Result Value Ref Range   Sodium 140 135 - 145 mmol/L   Potassium 3.8 3.5 - 5.1 mmol/L   Chloride 99 (L) 101 - 111 mmol/L   BUN 23 (H) 6 - 20 mg/dL   Creatinine, Ser 7.82 0.61 - 1.24 mg/dL   Glucose, Bld 91 65 - 99 mg/dL   Calcium, Ion 9.56 1.12 - 1.23 mmol/L   TCO2 29 0 - 100 mmol/L   Hemoglobin 15.3 13.0 - 17.0 g/dL   HCT 21.3 08.6 - 57.8 %   No results found.  Medications  clindamycin (CLEOCIN) IVPB 600 mg (0 mg Intravenous Stopped 01/12/15 0025)  ketorolac (TORADOL) 30 MG/ML injection 30 mg (30 mg Intravenous Given 01/11/15 2354)  sodium chloride 0.9 % bolus 500 mL (0 mLs Intravenous Stopped 01/12/15 0043)   No criteria for admission at this time.  Will  discharge home on clindamycin and mupiricin. Keep clean and dry    I, Partick Musselman-RASCH,Amarilys Lyles K, personally performed the services described in this documentation. All medical record entries made by the scribe were at my direction and in my presence.  I have reviewed the chart and discharge instructions and agree that the record reflects my personal performance and is accurate and complete. Skanda Worlds-RASCH,Jarica Plass K.  01/12/2015. 1:12 AM.      Damaria Stofko, MD 01/12/15 [id number]

## 2015-01-11 NOTE — ED Notes (Signed)
Pt presents w/ cellulitis to the left upper thigh.  Per pt he has had this in the past requiring hospitalization for IV antibiotics.  Pt marked area PTA and there is progression of redness, area remarked.

## 2015-01-12 ENCOUNTER — Encounter (HOSPITAL_COMMUNITY): Payer: Self-pay | Admitting: Emergency Medicine

## 2015-01-12 LAB — I-STAT CHEM 8, ED
BUN: 23 mg/dL — ABNORMAL HIGH (ref 6–20)
CALCIUM ION: 1.2 mmol/L (ref 1.12–1.23)
CHLORIDE: 99 mmol/L — AB (ref 101–111)
Creatinine, Ser: 1.2 mg/dL (ref 0.61–1.24)
GLUCOSE: 91 mg/dL (ref 65–99)
HCT: 45 % (ref 39.0–52.0)
HEMOGLOBIN: 15.3 g/dL (ref 13.0–17.0)
Potassium: 3.8 mmol/L (ref 3.5–5.1)
Sodium: 140 mmol/L (ref 135–145)
TCO2: 29 mmol/L (ref 0–100)

## 2015-01-12 LAB — CBC WITH DIFFERENTIAL/PLATELET
Basophils Absolute: 0 K/uL (ref 0.0–0.1)
Basophils Relative: 0 %
Eosinophils Absolute: 0.3 K/uL (ref 0.0–0.7)
Eosinophils Relative: 2 %
HCT: 43.5 % (ref 39.0–52.0)
Hemoglobin: 14.5 g/dL (ref 13.0–17.0)
Lymphocytes Relative: 30 %
Lymphs Abs: 3.3 K/uL (ref 0.7–4.0)
MCH: 30.1 pg (ref 26.0–34.0)
MCHC: 33.3 g/dL (ref 30.0–36.0)
MCV: 90.2 fL (ref 78.0–100.0)
Monocytes Absolute: 1.3 K/uL — ABNORMAL HIGH (ref 0.1–1.0)
Monocytes Relative: 12 %
Neutro Abs: 6.2 K/uL (ref 1.7–7.7)
Neutrophils Relative %: 56 %
Platelets: 253 K/uL (ref 150–400)
RBC: 4.82 MIL/uL (ref 4.22–5.81)
RDW: 12.8 % (ref 11.5–15.5)
WBC: 11.1 K/uL — ABNORMAL HIGH (ref 4.0–10.5)

## 2015-01-12 MED ORDER — NAPROXEN 375 MG PO TABS: 375.0000 mg | ORAL_TABLET | Freq: Two times a day (BID) | ORAL | Status: DC

## 2015-01-12 MED ORDER — CLINDAMYCIN HCL 300 MG PO CAPS: 300.0000 mg | ORAL_CAPSULE | Freq: Four times a day (QID) | ORAL | Status: DC

## 2015-01-12 MED ORDER — MUPIROCIN CALCIUM 2 % EX CREA: 1.0000 "application " | TOPICAL_CREAM | Freq: Two times a day (BID) | CUTANEOUS | Status: DC

## 2015-01-12 NOTE — Discharge Instructions (Signed)

## 2016-04-06 DIAGNOSIS — F959 Tic disorder, unspecified: Secondary | ICD-10-CM | POA: Diagnosis not present

## 2016-04-06 DIAGNOSIS — F332 Major depressive disorder, recurrent severe without psychotic features: Secondary | ICD-10-CM | POA: Diagnosis not present

## 2016-04-07 DIAGNOSIS — F959 Tic disorder, unspecified: Secondary | ICD-10-CM | POA: Diagnosis not present

## 2016-04-07 DIAGNOSIS — F332 Major depressive disorder, recurrent severe without psychotic features: Secondary | ICD-10-CM | POA: Diagnosis not present

## 2016-04-21 DIAGNOSIS — F959 Tic disorder, unspecified: Secondary | ICD-10-CM | POA: Diagnosis not present

## 2016-04-21 DIAGNOSIS — F332 Major depressive disorder, recurrent severe without psychotic features: Secondary | ICD-10-CM | POA: Diagnosis not present

## 2016-04-27 DIAGNOSIS — F332 Major depressive disorder, recurrent severe without psychotic features: Secondary | ICD-10-CM | POA: Diagnosis not present

## 2016-04-27 DIAGNOSIS — F959 Tic disorder, unspecified: Secondary | ICD-10-CM | POA: Diagnosis not present

## 2016-06-01 DIAGNOSIS — F959 Tic disorder, unspecified: Secondary | ICD-10-CM | POA: Diagnosis not present

## 2016-06-01 DIAGNOSIS — F332 Major depressive disorder, recurrent severe without psychotic features: Secondary | ICD-10-CM | POA: Diagnosis not present

## 2016-06-02 DIAGNOSIS — F959 Tic disorder, unspecified: Secondary | ICD-10-CM | POA: Diagnosis not present

## 2016-06-02 DIAGNOSIS — F332 Major depressive disorder, recurrent severe without psychotic features: Secondary | ICD-10-CM | POA: Diagnosis not present

## 2016-07-13 DIAGNOSIS — F959 Tic disorder, unspecified: Secondary | ICD-10-CM | POA: Diagnosis not present

## 2016-07-13 DIAGNOSIS — F332 Major depressive disorder, recurrent severe without psychotic features: Secondary | ICD-10-CM | POA: Diagnosis not present

## 2016-07-22 DIAGNOSIS — Z3009 Encounter for other general counseling and advice on contraception: Secondary | ICD-10-CM | POA: Diagnosis not present

## 2016-09-02 DIAGNOSIS — L0102 Bockhart's impetigo: Secondary | ICD-10-CM | POA: Diagnosis not present

## 2016-09-02 DIAGNOSIS — L739 Follicular disorder, unspecified: Secondary | ICD-10-CM | POA: Diagnosis not present

## 2016-09-07 DIAGNOSIS — L0291 Cutaneous abscess, unspecified: Secondary | ICD-10-CM | POA: Diagnosis not present

## 2016-09-23 DIAGNOSIS — Z302 Encounter for sterilization: Secondary | ICD-10-CM | POA: Diagnosis not present

## 2016-10-05 DIAGNOSIS — F959 Tic disorder, unspecified: Secondary | ICD-10-CM | POA: Diagnosis not present

## 2016-10-05 DIAGNOSIS — F332 Major depressive disorder, recurrent severe without psychotic features: Secondary | ICD-10-CM | POA: Diagnosis not present

## 2016-12-21 DIAGNOSIS — F959 Tic disorder, unspecified: Secondary | ICD-10-CM | POA: Diagnosis not present

## 2016-12-21 DIAGNOSIS — F332 Major depressive disorder, recurrent severe without psychotic features: Secondary | ICD-10-CM | POA: Diagnosis not present

## 2017-05-31 DIAGNOSIS — F959 Tic disorder, unspecified: Secondary | ICD-10-CM | POA: Diagnosis not present

## 2017-05-31 DIAGNOSIS — F332 Major depressive disorder, recurrent severe without psychotic features: Secondary | ICD-10-CM | POA: Diagnosis not present

## 2017-09-06 DIAGNOSIS — Z125 Encounter for screening for malignant neoplasm of prostate: Secondary | ICD-10-CM | POA: Diagnosis not present

## 2017-09-06 DIAGNOSIS — Z0000 Encounter for general adult medical examination without abnormal findings: Secondary | ICD-10-CM | POA: Diagnosis not present

## 2017-09-07 DIAGNOSIS — F959 Tic disorder, unspecified: Secondary | ICD-10-CM | POA: Diagnosis not present

## 2017-09-07 DIAGNOSIS — F332 Major depressive disorder, recurrent severe without psychotic features: Secondary | ICD-10-CM | POA: Diagnosis not present

## 2017-09-08 DIAGNOSIS — R739 Hyperglycemia, unspecified: Secondary | ICD-10-CM | POA: Diagnosis not present

## 2017-09-08 DIAGNOSIS — Z0000 Encounter for general adult medical examination without abnormal findings: Secondary | ICD-10-CM | POA: Diagnosis not present

## 2017-09-08 DIAGNOSIS — E782 Mixed hyperlipidemia: Secondary | ICD-10-CM | POA: Diagnosis not present

## 2017-09-08 DIAGNOSIS — Z6829 Body mass index (BMI) 29.0-29.9, adult: Secondary | ICD-10-CM | POA: Diagnosis not present

## 2017-10-02 ENCOUNTER — Ambulatory Visit (INDEPENDENT_AMBULATORY_CARE_PROVIDER_SITE_OTHER): Payer: Self-pay | Admitting: Nurse Practitioner

## 2017-10-02 VITALS — BP 125/80 | HR 61 | Temp 98.1°F | Resp 16 | Wt 230.0 lb

## 2017-10-02 DIAGNOSIS — M5412 Radiculopathy, cervical region: Secondary | ICD-10-CM

## 2017-10-02 MED ORDER — BACLOFEN 10 MG PO TABS: 10.0000 mg | ORAL_TABLET | Freq: Three times a day (TID) | ORAL | 0 refills | Status: DC

## 2017-10-02 MED ORDER — PREDNISONE 10 MG (21) PO TBPK: ORAL_TABLET | ORAL | 0 refills | Status: DC

## 2017-10-02 NOTE — Patient Instructions (Addendum)
Cervical Radiculopathy Extra Strength Tylenol as directed. Apply heat to the neck 3-4 times per day for the next 48 hours, then apply ice. Follow up with PCP for further work-up or evaluation if symptoms do not improve.  Cervical radiculopathy happens when a nerve in the neck (cervical nerve) is pinched or bruised. This condition can develop because of an injury or as part of the normal aging process. Pressure on the cervical nerves can cause pain or numbness that runs from the neck all the way down into the arm and fingers. Usually, this condition gets better with rest. Treatment may be needed if the condition does not improve. What are the causes? This condition may be caused by:  Injury.  Slipped (herniated) disk.  Muscle tightness in the neck because of overuse.  Arthritis.  Breakdown or degeneration in the bones and joints of the spine (spondylosis) due to aging.  Bone spurs that may develop near the cervical nerves.  What are the signs or symptoms? Symptoms of this condition include:  Pain that runs from the neck to the arm and hand. The pain can be severe or irritating. It may be worse when the neck is moved.  Numbness or weakness in the affected arm and hand.  How is this diagnosed? This condition may be diagnosed based on symptoms, medical history, and a physical exam. You may also have tests, including:  X-rays.  CT scan.  MRI.  Electromyogram (EMG).  Nerve conduction tests.  How is this treated? In many cases, treatment is not needed for this condition. With rest, the condition usually gets better over time. If treatment is needed, options may include:  Wearing a soft neck collar for short periods of time.  Physical therapy to strengthen your neck muscles.  Medicines, such as NSAIDs, oral corticosteroids, or spinal injections.  Surgery. This may be needed if other treatments do not help. Various types of surgery may be done depending on the cause of your  problems.  Follow these instructions at home: Managing pain  Take over-the-counter and prescription medicines only as told by your health care provider.  If directed, apply ice to the affected area. ? Put ice in a plastic bag. ? Place a towel between your skin and the bag. ? Leave the ice on for 20 minutes, 2-3 times per day.  If ice does not help, you can try using heat. Take a warm shower or warm bath, or use a heat pack as told by your health care provider.  Try a gentle neck and shoulder massage to help relieve symptoms. Activity  Rest as needed. Follow instructions from your health care provider about any restrictions on activities.  Do stretching and strengthening exercises as told by your health care provider or physical therapist. General instructions  If you were given a soft collar, wear it as told by your health care provider.  Use a flat pillow when you sleep.  Keep all follow-up visits as told by your health care provider. This is important. Contact a health care provider if:  Your condition does not improve with treatment. Get help right away if:  Your pain gets much worse and cannot be controlled with medicines.  You have weakness or numbness in your hand, arm, face, or leg.  You have a high fever.  You have a stiff, rigid neck.  You lose control of your bowels or your bladder (have incontinence).  You have trouble with walking, balance, or speaking. This information is not intended  to replace advice given to you by your health care provider. Make sure you discuss any questions you have with your health care provider. Document Released: 12/07/2000 Document Revised: 08/20/2015 Document Reviewed: 05/08/2014 Elsevier Interactive Patient Education  2018 [employer] Inc[employer]  Neck Exercises Neck exercises can be important for many reasons:  They can help you to improve and maintain flexibility in your neck. This can be especially important as you age.  They can  help to make your neck stronger. This can make movement easier.  They can reduce or prevent neck pain.  They may help your upper back.  Ask your health care provider which neck exercises would be best for you. Exercises Neck Press Repeat this exercise 10 times. Do it first thing in the morning and right before bed or as told by your health care provider. 1. Lie on your back on a firm bed or on the floor with a pillow under your head. 2. Use your neck muscles to push your head down on the pillow and straighten your spine. 3. Hold the position as well as you can. Keep your head facing up and your chin tucked. 4. Slowly count to 5 while holding this position. 5. Relax for a few seconds. Then repeat.  Isometric Strengthening Do a full set of these exercises 2 times a day or as told by your health care provider. 1. Sit in a supportive chair and place your hand on your forehead. 2. Push forward with your head and neck while pushing back with your hand. Hold for 10 seconds. 3. Relax. Then repeat the exercise 3 times. 4. Next, do thesequence again, this time putting your hand against the back of your head. Use your head and neck to push backward against the hand pressure. 5. Finally, do the same exercise on either side of your head, pushing sideways against the pressure of your hand.  Prone Head Lifts Repeat this exercise 5 times. Do this 2 times a day or as told by your health care provider. 1. Lie face-down, resting on your elbows so that your chest and upper back are raised. 2. Start with your head facing downward, near your chest. Position your chin either on or near your chest. 3. Slowly lift your head upward. Lift until you are looking straight ahead. Then continue lifting your head as far back as you can stretch. 4. Hold your head up for 5 seconds. Then slowly lower it to your starting position.  Supine Head Lifts Repeat this exercise 8-10 times. Do this 2 times a day or as told by  your health care provider. 1. Lie on your back, bending your knees to point to the ceiling and keeping your feet flat on the floor. 2. Lift your head slowly off the floor, raising your chin toward your chest. 3. Hold for 5 seconds. 4. Relax and repeat.  Scapular Retraction Repeat this exercise 5 times. Do this 2 times a day or as told by your health care provider. 1. Stand with your arms at your sides. Look straight ahead. 2. Slowly pull both shoulders backward and downward until you feel a stretch between your shoulder blades in your upper back. 3. Hold for 10-30 seconds. 4. Relax and repeat.  Contact a health care provider if:  Your neck pain or discomfort gets much worse when you do an exercise.  Your neck pain or discomfort does not improve within 2 hours after you exercise. If you have any of these problems, stop exercising right  away. Do not do the exercises again unless your health care provider says that you can. Get help right away if:  You develop sudden, severe neck pain. If this happens, stop exercising right away. Do not do the exercises again unless your health care provider says that you can. Exercises Neck Stretch  Repeat this exercise 3-5 times. 1. Do this exercise while standing or while sitting in a chair. 2. Place your feet flat on the floor, shoulder-width apart. 3. Slowly turn your head to the right. Turn it all the way to the right so you can look over your right shoulder. Do not tilt or tip your head. 4. Hold this position for 10-30 seconds. 5. Slowly turn your head to the left, to look over your left shoulder. 6. Hold this position for 10-30 seconds.  Neck Retraction Repeat this exercise 8-10 times. Do this 3-4 times a day or as told by your health care provider. 1. Do this exercise while standing or while sitting in a sturdy chair. 2. Look straight ahead. Do not bend your neck. 3. Use your fingers to push your chin backward. Do not bend your neck for  this movement. Continue to face straight ahead. If you are doing the exercise properly, you will feel a slight sensation in your throat and a stretch at the back of your neck. 4. Hold the stretch for 1-2 seconds. Relax and repeat.  This information is not intended to replace advice given to you by your health care provider. Make sure you discuss any questions you have with your health care provider. Document Released: 02/23/2015 Document Revised: 08/20/2015 Document Reviewed: 09/22/2014 Elsevier Interactive Patient Education  [employer]2018 [employer] Inc.

## 2017-10-02 NOTE — Progress Notes (Signed)
Subjective:     Ethan Matthews is a 47 y.o. male who presents for evaluation of neck pain. Event that precipitated these symptoms: none known. Onset of symptoms was 3 weeks ago, and have been gradually worsening since that time. Current symptoms are numbness in right arm and pain in right neck (shooting in character; 6/10 in severity). Patient denies pain in his left neck, left upper extremity and left back.  Patient has had no prior neck problems. Previous treatments: none and Tylenol.  The following portions of the patient's history were reviewed and updated as appropriate: allergies, current medications and past medical history.  Review of Systems Constitutional: negative Ears, nose, mouth, throat, and face: negative Respiratory: negative Cardiovascular: negative Musculoskeletal:positive for neck pain, negative for arthralgias, back pain, bone pain and muscle weakness Neurological: negative, numbness in right arm, feels like right arm is "heavy"    Objective:    BP 125/80 (BP Location: Left Arm, Patient Position: Sitting, Cuff Size: Normal)   Pulse 61   Temp 98.1 F (36.7 C) (Oral)   Resp 16   Wt 230 lb (104.3 kg)   SpO2 96%   BMI 28.75 kg/m  General:   alert, cooperative and no distress  External Deformity:  absent  ROM Cervical Spine:  normal range of motion  Midline Tenderness:  absent midline  Paraspinous tenderness:  absent midline  UE Neurologic Exam:  unremarkable   X-ray of the cervical spine: Not indicated    Assessment:    Cervical Radiculopathy    Plan:   Exam findings, diagnosis etiology and medication use and indications reviewed with patient. Follow- Up and discharge instructions provided. No emergent/urgent issues found on exam.  Patient verbalized understanding of information provided and agrees with plan of care (POC), all questions answered.  Cervical Radiculopathy -Sterapred Dose Pack as directed. -Baclofen 10mg  three times daily as needed for  muscle spasticity -Extra Strength Tylenol as directed. -Neck Exercises as provided. -Follow up with PCP if symptoms do not improve.  Meds ordered this encounter  Medications  . predniSONE (STERAPRED UNI-PAK 21 TAB) 10 MG (21) TBPK tablet    Sig: Take as directed.    Dispense:  21 tablet    Refill:  0    Order Specific Question:   Supervising Provider    Answer:   Stacie GlazeJENKINS, JOHN E [[id number]]  . baclofen (LIORESAL) 10 MG tablet    Sig: Take 1 tablet (10 mg total) by mouth 3 (three) times daily for 10 days.    Dispense:  30 each    Refill:  0    Order Specific Question:   Supervising Provider    Answer:   Stacie GlazeJENKINS, JOHN E [phone][[id number]]

## 2017-10-03 MED ORDER — PREDNISONE 10 MG (21) PO TBPK
ORAL_TABLET | ORAL | 0 refills | Status: AC
Start: 2017-10-03 — End: 2017-10-09

## 2017-10-03 MED ORDER — BACLOFEN 10 MG PO TABS: 10.0000 mg | ORAL_TABLET | Freq: Three times a day (TID) | ORAL | 0 refills | Status: AC

## 2017-10-03 NOTE — Addendum Note (Signed)
Addended by: Jackquline BerlinLEATH, Ryin Schillo J on: 10/03/2017 10:34 AM   Modules accepted: Orders

## 2017-10-06 DIAGNOSIS — R252 Cramp and spasm: Secondary | ICD-10-CM | POA: Diagnosis not present

## 2017-10-06 DIAGNOSIS — Z6829 Body mass index (BMI) 29.0-29.9, adult: Secondary | ICD-10-CM | POA: Diagnosis not present

## 2017-10-06 DIAGNOSIS — M542 Cervicalgia: Secondary | ICD-10-CM | POA: Diagnosis not present

## 2017-10-09 DIAGNOSIS — Z6829 Body mass index (BMI) 29.0-29.9, adult: Secondary | ICD-10-CM | POA: Diagnosis not present

## 2017-10-09 DIAGNOSIS — F411 Generalized anxiety disorder: Secondary | ICD-10-CM | POA: Diagnosis not present

## 2017-10-09 DIAGNOSIS — M542 Cervicalgia: Secondary | ICD-10-CM | POA: Diagnosis not present

## 2017-10-09 DIAGNOSIS — R252 Cramp and spasm: Secondary | ICD-10-CM | POA: Diagnosis not present

## 2017-10-18 DIAGNOSIS — M542 Cervicalgia: Secondary | ICD-10-CM | POA: Diagnosis not present

## 2017-10-18 DIAGNOSIS — R252 Cramp and spasm: Secondary | ICD-10-CM | POA: Diagnosis not present

## 2017-10-18 DIAGNOSIS — Z6829 Body mass index (BMI) 29.0-29.9, adult: Secondary | ICD-10-CM | POA: Diagnosis not present

## 2017-10-24 ENCOUNTER — Ambulatory Visit (INDEPENDENT_AMBULATORY_CARE_PROVIDER_SITE_OTHER): Payer: Self-pay | Admitting: Family Medicine

## 2017-10-24 ENCOUNTER — Encounter: Payer: Self-pay | Admitting: Family Medicine

## 2017-10-24 VITALS — BP 100/77 | HR 63 | Temp 98.5°F | Wt 228.8 lb

## 2017-10-24 DIAGNOSIS — H1032 Unspecified acute conjunctivitis, left eye: Secondary | ICD-10-CM

## 2017-10-24 MED ORDER — CIPROFLOXACIN HCL 0.3 % OP SOLN
2.0000 [drp] | Freq: Four times a day (QID) | OPHTHALMIC | 0 refills | Status: DC
Start: 2017-10-24 — End: 2021-08-31

## 2017-10-24 NOTE — Progress Notes (Signed)
Ethan Matthews is a 47 y.o. male who presents today with concerns of of left eye irritation for the last day. 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 He reports that his vision is unaffected and he has slight pain and burning to the affected eye a 2-3/10 pain scale.  Review of Systems  Constitutional: Negative for chills, fever and malaise/fatigue.  HENT: Negative for congestion, ear discharge, ear pain, sinus pain and sore throat.   Eyes: Positive for pain, discharge and redness. Negative for blurred vision, double vision and photophobia.  Respiratory: Negative for cough, sputum production and shortness of breath.   Cardiovascular: Negative.  Negative for chest pain.  Gastrointestinal: Negative for abdominal pain, diarrhea, nausea and vomiting.  Genitourinary: Negative for dysuria, frequency, hematuria and urgency.  Musculoskeletal: Negative for myalgias.  Skin: Negative.   Neurological: Negative for headaches.  Endo/Heme/Allergies: Negative.   Psychiatric/Behavioral: Negative.     O: Vitals:   10/24/17 0841  BP: 100/77  Pulse: 63  Temp: 98.5 F (36.9 C)  SpO2: 98%     Physical Exam  Constitutional: He is oriented to person, place, and time. Vital signs are normal. He appears well-developed and well-nourished. He is active.  Non-toxic appearance. He does not have a sickly appearance.  HENT:  Head: Normocephalic.  Right Ear: Hearing, tympanic membrane, external ear and ear canal normal.  Left Ear: Hearing, tympanic membrane, external ear and ear canal normal.  Nose: Nose normal.  Mouth/Throat: Uvula is midline and oropharynx is clear and moist.  Eyes: Pupils are equal, round, and reactive to light. EOM and lids are normal. Right eye  exhibits discharge. Left eye exhibits discharge. Right conjunctiva is injected. Right conjunctiva has a hemorrhage. Left conjunctiva is injected. Left conjunctiva has a hemorrhage.  Neck: Normal range of motion. Neck supple.  Cardiovascular: Normal rate, regular rhythm, normal heart sounds and normal pulses.  Pulmonary/Chest: Effort normal and breath sounds normal.  Abdominal: Soft. Bowel sounds are normal.  Musculoskeletal: Normal range of motion.  Lymphadenopathy:       Head (right side): No submental and no submandibular adenopathy present.       Head (left side): No submental and no submandibular adenopathy present.    He has no cervical adenopathy.  Neurological: He is alert and oriented to person, place, and time.  Psychiatric: He has a normal mood and affect.  Vitals reviewed.    A: 1. Acute conjunctivitis of left eye, unspecified acute conjunctivitis type      P: Exam findings, diagnosis etiology and medication use and indications reviewed with patient. Follow- Up and discharge instructions provided. No emergent/urgent issues found on exam.  Patient verbalized understanding of information provided and agrees with plan of care (POC), all questions answered.  1. Acute conjunctivitis of left eye, unspecified acute conjunctivitis type - ciprofloxacin (CILOXAN) 0.3 % ophthalmic solution; Place 2 drops into the left eye 4 (four) times daily. Apply 1 drop, every 4 hours, while awake, for the first 2 days.

## 2017-10-24 NOTE — Patient Instructions (Signed)

## 2017-10-26 ENCOUNTER — Telehealth: Payer: Self-pay

## 2017-10-26 NOTE — Telephone Encounter (Signed)
Patient states he is not doing any better, he is having fever, for which he is taking Tylenol, and that helps him a little bit, the pink eye is still bothering him, and he said he is using the ye drops every 4 hrs as prescribed by the provider. I told the patient I will call him back to ask the provider what we can do to improve his symptoms.

## 2017-10-26 NOTE — Telephone Encounter (Signed)
I called the patient back, as I told the patient  will do after I speak with the provider, but the patient did not answered the call and the voicemail is full. The provider told me to tell teh patient to follow up with his PCP, because of the fever he is having after being seeing her for pink eye.

## 2017-10-26 NOTE — Telephone Encounter (Signed)
Patient called back and I explained to him, that the provider suggested he needs to follow up with his PCP, as he is having fever after being seeing for pink eye, patient agree to follow up with his PCP.

## 2017-10-27 DIAGNOSIS — Z6829 Body mass index (BMI) 29.0-29.9, adult: Secondary | ICD-10-CM | POA: Diagnosis not present

## 2017-10-27 DIAGNOSIS — H109 Unspecified conjunctivitis: Secondary | ICD-10-CM | POA: Diagnosis not present

## 2017-10-29 ENCOUNTER — Emergency Department (HOSPITAL_BASED_OUTPATIENT_CLINIC_OR_DEPARTMENT_OTHER)
Admission: EM | Admit: 2017-10-29 | Discharge: 2017-10-29 | Disposition: A | Discharge status: Home / Self Care | Payer: 59 | Attending: Emergency Medicine | Admitting: Emergency Medicine

## 2017-10-29 ENCOUNTER — Other Ambulatory Visit: Payer: Self-pay

## 2017-10-29 ENCOUNTER — Emergency Department (HOSPITAL_BASED_OUTPATIENT_CLINIC_OR_DEPARTMENT_OTHER): Payer: 59

## 2017-10-29 ENCOUNTER — Encounter (HOSPITAL_BASED_OUTPATIENT_CLINIC_OR_DEPARTMENT_OTHER): Payer: Self-pay | Admitting: Emergency Medicine

## 2017-10-29 DIAGNOSIS — Y999 Unspecified external cause status: Secondary | ICD-10-CM | POA: Diagnosis not present

## 2017-10-29 DIAGNOSIS — L03213 Periorbital cellulitis: Secondary | ICD-10-CM | POA: Diagnosis not present

## 2017-10-29 DIAGNOSIS — Z79899 Other long term (current) drug therapy: Secondary | ICD-10-CM | POA: Insufficient documentation

## 2017-10-29 DIAGNOSIS — Y9301 Activity, walking, marching and hiking: Secondary | ICD-10-CM | POA: Insufficient documentation

## 2017-10-29 DIAGNOSIS — X58XXXA Exposure to other specified factors, initial encounter: Secondary | ICD-10-CM | POA: Diagnosis not present

## 2017-10-29 DIAGNOSIS — Y92828 Other wilderness area as the place of occurrence of the external cause: Secondary | ICD-10-CM | POA: Insufficient documentation

## 2017-10-29 DIAGNOSIS — R220 Localized swelling, mass and lump, head: Secondary | ICD-10-CM | POA: Diagnosis not present

## 2017-10-29 DIAGNOSIS — S0502XA Injury of conjunctiva and corneal abrasion without foreign body, left eye, initial encounter: Secondary | ICD-10-CM | POA: Diagnosis not present

## 2017-10-29 DIAGNOSIS — R05 Cough: Secondary | ICD-10-CM | POA: Diagnosis not present

## 2017-10-29 DIAGNOSIS — S0592XA Unspecified injury of left eye and orbit, initial encounter: Secondary | ICD-10-CM | POA: Diagnosis present

## 2017-10-29 DIAGNOSIS — H05012 Cellulitis of left orbit: Secondary | ICD-10-CM | POA: Diagnosis not present

## 2017-10-29 DIAGNOSIS — H5712 Ocular pain, left eye: Secondary | ICD-10-CM | POA: Diagnosis not present

## 2017-10-29 LAB — GROUP A STREP BY PCR: Group A Strep by PCR: NOT DETECTED

## 2017-10-29 MED ORDER — FLUORESCEIN SODIUM 1 MG OP STRP
1.0000 | ORAL_STRIP | Freq: Once | OPHTHALMIC | Status: AC
  Administered 2017-10-29: 1 via OPHTHALMIC
  Filled 2017-10-29: qty 1

## 2017-10-29 MED ORDER — TETRACAINE HCL 0.5 % OP SOLN
2.0000 [drp] | Freq: Once | OPHTHALMIC | Status: AC
  Administered 2017-10-29: 2 [drp] via OPHTHALMIC
  Filled 2017-10-29: qty 4

## 2017-10-29 MED ORDER — TOBRAMYCIN 0.3 % OP SOLN
2.0000 [drp] | OPHTHALMIC | Status: DC
Start: 2017-10-29 — End: 2017-10-29
  Administered 2017-10-29: 2 [drp] via OPHTHALMIC
  Filled 2017-10-29: qty 5

## 2017-10-29 MED ORDER — CLINDAMYCIN HCL 150 MG PO CAPS: 450.0000 mg | ORAL_CAPSULE | Freq: Three times a day (TID) | ORAL | 0 refills | Status: DC

## 2017-10-29 MED ORDER — CLINDAMYCIN HCL 150 MG PO CAPS
450.0000 mg | ORAL_CAPSULE | Freq: Once | ORAL | Status: AC
  Administered 2017-10-29: 450 mg via ORAL
  Filled 2017-10-29: qty 3

## 2017-10-29 NOTE — ED Notes (Signed)
ED Provider at bedside. 

## 2017-10-29 NOTE — ED Notes (Signed)
Patient transported to X-ray 

## 2017-10-29 NOTE — ED Notes (Signed)
Patient transported to CT 

## 2017-10-29 NOTE — ED Triage Notes (Signed)
Patient states that he has had left  eye pain and redness for about a week  - patient has gone to 2 other dr's for this. He also reports that he has had a fever up till wed - His PMD started him on Nyacin drops on Friday  - he reports that they are not helping him at this point

## 2017-10-29 NOTE — Discharge Instructions (Signed)
Use tobramycin eyedrops every 2 hours as prescribed.  Begin taking clindamycin 3 times daily.  I recommend taking this with a probiotic to help prevent stomach upset.  It is important that you see an ophthalmologist in 24 to 48 hours.  Please call the one below tomorrow morning to make an appointment.  Please return to emergency department if you develop any new or worsening symptoms including loss of vision, severe pain, or any other new or concerning symptom.

## 2017-10-30 NOTE — ED Provider Notes (Signed)
[facility] EMERGENCY DEPARTMENT Provider Note   CSN: [id number] Arrival date & time: 10/29/17  1555     History   Chief Complaint Chief Complaint  Patient presents with  . Eye Pain    HPI Ethan Matthews is a 47 y.o. male with history of abscess who presents with a one-week history of left eye pain, redness, drainage.  Patient reports [place] [address] [place] had some sweat in his eye, but otherwise did not get anything in his eye.  He also reports sore throat, nasal congestion, cough, with occasional shortness of breath.  He reports having a fever up until 3 days ago.  His fever was low-grade around 100.  He has been on Cipro ophthalmic drops which his primary care provider stopped and switched him to tobramycin and an antihistamine drop.  The Cipro drops were reportedly making his eye worse.  He does not wear glasses or contacts.  He reports history of Lasik surgery.  He reports both his eyes have been matted with drainage in the mornings.  HPI  History reviewed. No pertinent past medical history.  Patient Active Problem List   Diagnosis Date Noted  . Abscess and cellulitis 08/15/2014  . Cellulitis and abscess of leg 08/15/2014  . Abscess 08/15/2014    History reviewed. No pertinent surgical history.      Home Medications    Prior to Admission medications   Medication Sig Start Date End Date Taking? Authorizing Provider  ciprofloxacin (CILOXAN) 0.3 % ophthalmic solution Place 2 drops into the left eye 4 (four) times daily. Apply 1 drop, every 4 hours, while awake, for the first 2 days. 10/24/17   Zachery DauerGraham, Elysa, NP  clindamycin (CLEOCIN) 150 MG capsule Take 3 capsules (450 mg total) by mouth 3 (three) times daily. 10/29/17   Brayley Mackowiak, Waylan BogaAlexandra M, PA-C  ibuprofen (ADVIL,MOTRIN) 200 MG tablet Take 400 mg by mouth every 6 (six) hours as needed for moderate pain.    [provider]  Multiple Vitamins-Minerals (MULTIVITAMIN MEN PO) Take 1 tablet by mouth  daily.    [provider]  mupirocin cream (BACTROBAN) 2 % Apply 1 application topically 2 (two) times daily. Patient not taking: Reported on 10/02/2017 01/12/15   Palumbo, April, MD  naproxen (NAPROSYN) 375 MG tablet Take 1 tablet (375 mg total) by mouth 2 (two) times daily. Patient not taking: Reported on 10/02/2017 01/12/15   Palumbo, April, MD  sulfamethoxazole-trimethoprim (BACTRIM DS,SEPTRA DS) 800-160 MG per tablet Take 1 tablet by mouth 2 (two) times daily. Patient not taking: Reported on 01/11/2015 08/18/14   Leroy SeaSingh, Prashant K, MD  venlafaxine XR (EFFEXOR-XR) 37.5 MG 24 hr capsule Take by mouth. 05/12/16   [provider]    Family History Family History  Problem Relation Age of Onset  . Diabetes type II Father     Social History Social History   Tobacco Use  . Smoking status: Never Smoker  . Smokeless tobacco: Never Used  Substance Use Topics  . Alcohol use: No  . Drug use: No     Allergies   Patient has no known allergies.   Review of Systems Review of Systems  Constitutional: Positive for fever (resolved). Negative for chills.  HENT: Positive for congestion and sore throat. Negative for ear pain and facial swelling.   Eyes: Positive for photophobia, pain, discharge, redness and itching. Negative for visual disturbance.  Respiratory: Positive for cough. Negative for shortness of breath.   Cardiovascular: Negative for chest pain.  Gastrointestinal: Negative for abdominal pain, nausea and vomiting.  Genitourinary: Negative for dysuria.  Musculoskeletal: Negative for back pain.  Skin: Negative for rash and wound.  Neurological: Negative for headaches.  Psychiatric/Behavioral: The patient is not nervous/anxious.      Physical Exam Updated Vital Signs BP 115/80   Pulse 63   Temp 98.2 F (36.8 C)   Resp 16   Wt 103.4 kg (228 lb)   SpO2 99%   BMI 28.50 kg/m   Physical Exam  Constitutional: He appears well-developed and well-nourished. No  distress.  HENT:  Head: Normocephalic and atraumatic.  Mouth/Throat: Oropharynx is clear and moist. No oropharyngeal exudate.  Eyes: Pupils are equal, round, and reactive to light. EOM are normal. Right eye exhibits no discharge. Left eye exhibits chemosis, discharge and exudate. No foreign body present in the left eye. Left conjunctiva is injected. No scleral icterus.  3 x 4 mm corneal abrasion around 9:00 in the left cornea seen with fluorescein uptake; no other uptake noted bilaterally, doubt corneal ulcer   Visual Acuity  Right Eye Distance: 20/40 Left Eye Distance: 20/50 Bilateral Distance: 20/30     Neck: Normal range of motion. Neck supple. No thyromegaly present.  Cardiovascular: Normal rate, regular rhythm, normal heart sounds and intact distal pulses. Exam reveals no gallop and no friction rub.  No murmur heard. Pulmonary/Chest: Effort normal and breath sounds normal. No stridor. No respiratory distress. He has no wheezes. He has no rales.  Abdominal: Soft. Bowel sounds are normal. He exhibits no distension. There is no tenderness. There is no rebound and no guarding.  Musculoskeletal: He exhibits no edema.  Lymphadenopathy:    He has no cervical adenopathy.  Neurological: He is alert. Coordination normal.  Skin: Skin is warm and dry. No rash noted. He is not diaphoretic. No pallor.  Psychiatric: He has a normal mood and affect.  Nursing note and vitals reviewed.    ED Treatments / Results  Labs (all labs ordered are listed, but only abnormal results are displayed) Labs Reviewed  GROUP A STREP BY PCR    EKG None  Radiology Dg Chest 2 View  Result Date: 10/29/2017 CLINICAL DATA:  Patient states that he has had left eye pain and redness for about a week - patient has gone to 2 other dr's for this. He also reports that he has had a fever and cough up till wed - His PMD started him on Nyacin drops in on Friday. EXAM: CHEST - 2 VIEW COMPARISON:  None. FINDINGS: The heart  size and mediastinal contours are within normal limits. Both lungs are clear. The visualized skeletal structures are unremarkable. IMPRESSION: No active cardiopulmonary disease. Electronically Signed   By: Norva Pavlov M.D.   On: 10/29/2017 17:40   Ct Orbits Wo Contrast  Result Date: 10/29/2017 CLINICAL DATA:  LEFT eye pain and redness for 1 week.  Recent fever. EXAM: CT ORBITS WITHOUT CONTRAST TECHNIQUE: Multidetector CT images were obtained using the standard protocol without intravenous contrast. COMPARISON:  None. FINDINGS: ORBITS: Intact ocular globes. Lenses are located. Normal appearance of the optic nerve sheath complexes. Preservation of the orbital fat. Normal appearance of the extraocular muscles which are well located. Superior ophthalmic veins are not enlarged. VISUALIZED SINUSES: Trace paranasal sinus mucosal thickening. Mastoid air cells are well aerated. SOFT TISSUES/BONES: Mild LEFT periorbital soft tissue swelling without subcutaneous gas. Partially imaged lymph nodes bilateral parotid glands. No subcutaneous gas or radiopaque foreign bodies. No destructive bony lesions. INTRACRANIAL CONTENTS: Normal.  IMPRESSION: Mild LEFT periorbital soft tissue swelling, possible cellulitis without postseptal extension. Electronically Signed   By: Awilda Metro M.D.   On: 10/29/2017 19:10    Procedures Procedures (including critical care time)  Medications Ordered in ED Medications  tetracaine (PONTOCAINE) 0.5 % ophthalmic solution 2 drop (2 drops Left Eye Given by Other 10/29/17 1704)  fluorescein ophthalmic strip 1 strip (1 strip Left Eye Given by Other 10/29/17 1704)  fluorescein ophthalmic strip 1 strip (1 strip Right Eye Given by Other 10/29/17 1729)  clindamycin (CLEOCIN) capsule 450 mg (450 mg Oral Given 10/29/17 1938)     Initial Impression / Assessment and Plan / ED Course  I have reviewed the triage vital signs and the nursing notes.  Pertinent labs & imaging results that were  available during my care of the patient were reviewed by me and considered in my medical decision making (see chart for details).     Patient with conjunctivitis and preseptal cellulitis in the left eye.  Chest x-ray is negative.  Rapid strep is negative.  CT of the orbits shows cellulitis, but no post septal extension.  Will continue tobramycin, but add clindamycin.  Patient advised to follow-up with ophthalmology within 24 to 48 hours.  This is his third evaluation for this problem and advised the importance of seeing an ophthalmologist.  Patient given strict return precautions.  Patient understands and agrees with plan.  Patient vitals stable throughout ED course and discharged in satisfactory condition. Patient also evaluated by Dr. Silverio Lay who guided the patient's management and agrees with plan.  Final Clinical Impressions(s) / ED Diagnoses   Final diagnoses:  Abrasion of left cornea, initial encounter  Preseptal cellulitis of left eye    ED Discharge Orders        Ordered    clindamycin (CLEOCIN) 150 MG capsule  3 times daily     10/29/17 1927       Emi Holes, PA-C 10/30/17 0046    Charlynne Pander, MD 10/31/17 [phone]

## 2017-11-01 ENCOUNTER — Ambulatory Visit: Payer: 59 | Attending: Family Medicine

## 2017-11-01 ENCOUNTER — Other Ambulatory Visit: Payer: Self-pay

## 2017-11-01 DIAGNOSIS — H1032 Unspecified acute conjunctivitis, left eye: Secondary | ICD-10-CM | POA: Diagnosis not present

## 2017-11-01 DIAGNOSIS — R293 Abnormal posture: Secondary | ICD-10-CM | POA: Insufficient documentation

## 2017-11-01 DIAGNOSIS — M542 Cervicalgia: Secondary | ICD-10-CM | POA: Insufficient documentation

## 2017-11-01 DIAGNOSIS — Z6829 Body mass index (BMI) 29.0-29.9, adult: Secondary | ICD-10-CM | POA: Diagnosis not present

## 2017-11-01 DIAGNOSIS — R252 Cramp and spasm: Secondary | ICD-10-CM | POA: Insufficient documentation

## 2017-11-01 DIAGNOSIS — H109 Unspecified conjunctivitis: Secondary | ICD-10-CM | POA: Diagnosis not present

## 2017-11-01 NOTE — Patient Instructions (Signed)
Access Code: [email or web address]  URL: [email or web address]  Date: 11/01/2017  Prepared by: Lorrene ReidKelly Takacs   Exercises  Seated Cervical Flexion AROM - 3 reps - 1 sets - 20 hold - 4x daily - 7x weekly  Seated Cervical Sidebending AROM - 3 reps - 20 hold - 4x daily - 7x weekly  Seated Cervical Rotation AROM - 3 reps - 20 hold - 4x daily - 7x weekly  Seated Correct Posture - 10 reps - 3 sets - 1x daily - 7x weekly  Supine Suboccipital Release with Tennis Balls - 10 reps - 7x weekly

## 2017-11-01 NOTE — Therapy (Signed)
[facility] [address] [place], [place], [address] Phone: [phone]   Fax:  [phone]  Physical Therapy Evaluation  Patient Details  Name: Ethan Matthews MRN: [id number] Date of Birth: 04/15/1970 Referring Provider: Maryelizabeth Rowan, MD   Encounter Date: 11/01/2017  PT End of Session - 11/01/17 1011    Visit Number  1    Date for PT Re-Evaluation  12/27/17    Authorization Type  UMR    PT Start Time  0937    PT Stop Time  1012    PT Time Calculation (min)  35 min    Activity Tolerance  Patient tolerated treatment well    Behavior During Therapy  [facility] for tasks assessed/performed       History reviewed. No pertinent past medical history.  History reviewed. No pertinent surgical history.  There were no vitals filed for this visit.   Subjective Assessment - 11/01/17 0938    Subjective  Pt presents to PT with complaints of Rt>Lt neck pain that began 2-3 months ago and increased about 1 month ago.  Pt reports that he feels pain that goes toward his Rt UE.      Diagnostic tests  no imaging    Patient Stated Goals  reduce pain         [facility] PT Assessment - 11/01/17 0001      Assessment   Medical Diagnosis  neck pain    Referring Provider  Maryelizabeth Rowan, MD    Onset Date/Surgical Date  08/01/17    Hand Dominance  Right    Next MD Visit  11/01/17    Prior Therapy  none      Precautions   Precautions  None      Restrictions   Weight Bearing Restrictions  No      Balance Screen   Has the patient fallen in the past 6 months  No    Has the patient had a decrease in activity level because of a fear of falling?   No    Is the patient reluctant to leave their home because of a fear of falling?   No      Home Environment   Living Environment  Private residence    Type of Home  House      Prior Function   Level of Independence  Independent    Vocation  Full time employment    Vocation Requirements   Work at [employer]  jogging, basketball, hiking      Cognition   Overall Cognitive Status  Within Functional Limits for tasks assessed      Observation/Other Assessments   Focus on Therapeutic Outcomes (FOTO)   37% limitation      Posture/Postural Control   Posture/Postural Control  Postural limitations    Postural Limitations  Forward head;Rounded Shoulders      ROM / Strength   AROM / PROM / Strength  AROM;PROM;Strength      AROM   Overall AROM   Deficits    Overall AROM Comments  UE A/ROM is full     AROM Assessment Site  Cervical    Cervical Flexion  50    Cervical Extension  45    Cervical - Right Side Bend  20    Cervical - Left Side Bend  25    Cervical - Right Rotation  50    Cervical - Left Rotation  70  PROM   Overall PROM   Deficits    Overall PROM Comments  limited by 50% in rotation and sidebending bilaterally with pain      Strength   Overall Strength  Within functional limits for tasks performed    Overall Strength Comments  5/5 UE strength      Palpation   Spinal mobility  reduced PA mobility with pain C3-T3 Rt>Lt    Palpation comment  tension/trigger points over bil suboccipitals and upper traps, rhomboids and cervical/thoracic paraspinals      Transfers   Transfers  Independent with all Transfers      Ambulation/Gait   Gait Pattern  Within Functional Limits                Objective measurements completed on examination: See above findings.              PT Education - 11/01/17 1010    Education Details   Access Code: [email or web address]     Person(s) Educated  Patient    Methods  Explanation;Demonstration;Handout    Comprehension  Verbalized understanding;Returned demonstration       PT Short Term Goals - 11/01/17 1049      PT SHORT TERM GOAL #1   Title  be independent in initial HEP    Time  4    Period  Weeks    Status  New    Target Date  11/29/17      PT SHORT TERM GOAL #2   Title   demonstrate neutral seated posture > or = to 50% of the time and report postural corrections at home    Time  4    Period  Weeks    Status  New    Target Date  11/29/17      PT SHORT TERM GOAL #3   Title  report a 25% reduction in neck pain and headaches    Time  4    Period  Weeks    Status  New    Target Date  11/29/17        PT Long Term Goals - 11/01/17 1050      PT LONG TERM GOAL #1   Title  be independent in advanced HEP    Time  8    Period  Weeks    Status  New    Target Date  12/27/17      PT LONG TERM GOAL #2   Title  reduce FOTO To < or = to 28% limitation    Time  8    Period  Weeks    Status  New    Target Date  12/27/17      PT LONG TERM GOAL #3   Title  report a 60% reduction in frequency and intensity of headaches and neck pain    Time  8    Period  Weeks    Status  New    Target Date  12/27/17      PT LONG TERM GOAL #4   Title  demonstrate Rt cervical A/ROM rotation to > or = to 65 degrees to improve safety with driving    Time  8    Period  Weeks    Status  New    Target Date  12/27/17      PT LONG TERM GOAL #5   Title  demonstrate correct seated posture > or = to 75% of the time     Time  8  Period  Weeks    Status  New    Target Date  12/27/17             Plan - 11/01/17 1028    Clinical Impression Statement  Pt presents to PT with 3 month history of Rt neck pain that began without cause.  No imaging has been done.  Pt reports constant neck pain and daily headaches.  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  Pt demonstrates reduced cervical sidebending and Rt>Lt rotation with pain at end range.  Pt with limited segmental mobility in the cervical spine and upper thoracic spine.  Pt with tension and trigger point in bil upper traps, suboccipitals, scapular muscles and bil cervical and thoracic paraspinals.  Pt will benefit from skilled PT for manual/dry  needling, postural strength, cervical mobs and flexibility and modalities as needed.      History and Personal Factors relevant to plan of care:  none    Clinical Presentation  Stable    Clinical Presentation due to:  no complicating factors    Clinical Decision Making  Low    Rehab Potential  Good    PT Frequency  2x / week    PT Duration  8 weeks    PT Treatment/Interventions  ADLs/Self Care Home Management;Cryotherapy;Electrical Stimulation;Ultrasound;Traction;Moist Heat;Functional mobility training;Therapeutic activities;Therapeutic exercise;Patient/family education;Manual techniques;Passive range of motion;Taping;Dry needling    PT Next Visit Plan  dry needling to neck and suboccipitals, postural strength, cervical mobs and flexibility, pain management    PT Home Exercise Plan  Access Code: [email or web address]    Consulted and Agree with Plan of Care  Patient       Patient will benefit from skilled therapeutic intervention in order to improve the following deficits and impairments:  Pain, Postural dysfunction, Increased muscle spasms, Decreased activity tolerance, Decreased strength, Impaired flexibility, Hypomobility  Visit Diagnosis: Cervicalgia - Plan: PT plan of care cert/re-cert  Abnormal posture - Plan: PT plan of care cert/re-cert  Cramp and spasm - Plan: PT plan of care cert/re-cert     Problem List Patient Active Problem List   Diagnosis Date Noted  . Abscess and cellulitis 08/15/2014  . Cellulitis and abscess of leg 08/15/2014  . Abscess 08/15/2014     Lorrene Reid, PT 11/01/17 10:54 AM  [facility] [address] [place], [place], [address] Phone: [phone]   Fax:  [phone]  Name: Ethan Matthews MRN: [id number] Date of Birth: February 07, 1971

## 2017-11-03 DIAGNOSIS — H1032 Unspecified acute conjunctivitis, left eye: Secondary | ICD-10-CM | POA: Diagnosis not present

## 2017-11-15 ENCOUNTER — Ambulatory Visit: Payer: 59

## 2017-11-15 DIAGNOSIS — R293 Abnormal posture: Secondary | ICD-10-CM

## 2017-11-15 DIAGNOSIS — M542 Cervicalgia: Secondary | ICD-10-CM

## 2017-11-15 DIAGNOSIS — R252 Cramp and spasm: Secondary | ICD-10-CM | POA: Diagnosis not present

## 2017-11-15 NOTE — Therapy (Signed)
[facility] [address] [place], [place], [address] Phone: [phone]   Fax:  [phone]  Physical Therapy Treatment  Patient Details  Name: Ethan Matthews MRN: [id number] Date of Birth: 07/15/1970 Referring Provider: Maryelizabeth Rowanewey, Elizabeth, MD   Encounter Date: 11/15/2017  PT End of Session - 11/15/17 1015    Visit Number  2    Date for PT Re-Evaluation  12/27/17    Authorization Type  UMR    PT Start Time  0935    PT Stop Time  1023   dry needling   PT Time Calculation (min)  48 min    Activity Tolerance  Patient tolerated treatment well    Behavior During Therapy  [facility] for tasks assessed/performed       History reviewed. No pertinent past medical history.  History reviewed. No pertinent surgical history.  There were no vitals filed for this visit.  Subjective Assessment - 11/15/17 0937    Subjective  I went on a cruise and then I was sick.  I am back to work now and it has been brutal.      Pertinent History  involuntary twitch    Patient Stated Goals  reduce pain    Currently in Pain?  Yes    Pain Score  5     Pain Location  Neck    Pain Orientation  Right    Pain Descriptors / Indicators  Aching;Burning;Constant;Tightness    Pain Onset  More than a month ago    Pain Frequency  Constant    Aggravating Factors   when going to bed    Pain Relieving Factors  the more I move around                       [facility] Treatment/Exercise - 11/15/17 0001      Modalities   Modalities  Moist Heat      Moist Heat Therapy   Number Minutes Moist Heat  10 Minutes    Moist Heat Location  Cervical      Manual Therapy   Manual Therapy  Soft tissue mobilization;Myofascial release    Manual therapy comments  soft tissue elongation Rt>Lt cervical paraspinals, suboccipitals and upper traps       Trigger Point Dry Needling - 11/15/17 1012    Consent Given?  Yes    Education Handout Provided   Yes    Muscles Treated Upper Body  Upper trapezius;Oblique capitus;Suboccipitals muscle group   bil cervical multifidi   Upper Trapezius Response  Twitch reponse elicited;Palpable increased muscle length    Oblique Capitus Response  Twitch response elicited;Palpable increased muscle length    SubOccipitals Response  Twitch response elicited;Palpable increased muscle length           PT Education - 11/15/17 0940    Education Details  DN info    Person(s) Educated  Patient    Methods  Explanation;Demonstration;Handout    Comprehension  Verbalized understanding;Returned demonstration       PT Short Term Goals - 11/01/17 1049      PT SHORT TERM GOAL #1   Title  be independent in initial HEP    Time  4    Period  Weeks    Status  New    Target Date  11/29/17      PT SHORT TERM GOAL #2   Title  demonstrate neutral seated posture > or = to 50% of the  time and report postural corrections at home    Time  4    Period  Weeks    Status  New    Target Date  11/29/17      PT SHORT TERM GOAL #3   Title  report a 25% reduction in neck pain and headaches    Time  4    Period  Weeks    Status  New    Target Date  11/29/17        PT Long Term Goals - 11/01/17 1050      PT LONG TERM GOAL #1   Title  be independent in advanced HEP    Time  8    Period  Weeks    Status  New    Target Date  12/27/17      PT LONG TERM GOAL #2   Title  reduce FOTO To < or = to 28% limitation    Time  8    Period  Weeks    Status  New    Target Date  12/27/17      PT LONG TERM GOAL #3   Title  report a 60% reduction in frequency and intensity of headaches and neck pain    Time  8    Period  Weeks    Status  New    Target Date  12/27/17      PT LONG TERM GOAL #4   Title  demonstrate Rt cervical A/ROM rotation to > or = to 65 degrees to improve safety with driving    Time  8    Period  Weeks    Status  New    Target Date  12/27/17      PT LONG TERM GOAL #5   Title  demonstrate  correct seated posture > or = to 75% of the time     Time  8    Period  Weeks    Status  New    Target Date  12/27/17            Plan - 11/15/17 1014    Clinical Impression Statement  Pt with 1st patient after evaluation with lapse in treatment.  Pt reports continued Rt neck pain and headaches.  Session focused on manual therapy including dry needling and elongation to suboccipitals, paraspinals and upper traps.  Pt with tension and trigger points and demonstrated improved tissue mobility after therapy today.  Pt will continue to benefit from skilled PT for manual, cervical A/ROM, postural strength and headache management.      PT Frequency  2x / week    PT Duration  8 weeks    PT Treatment/Interventions  ADLs/Self Care Home Management;Cryotherapy;Electrical Stimulation;Ultrasound;Traction;Moist Heat;Functional mobility training;Therapeutic activities;Therapeutic exercise;Patient/family education;Manual techniques;Passive range of motion;Taping;Dry needling    PT Next Visit Plan  dry needling to neck and suboccipitals, postural strength, cervical mobs and flexibility, pain management.  Show Melt method for neck mobility    PT Home Exercise Plan  Access Code: [email or web address]    Recommended Other Services  initial cert is signed.    Consulted and Agree with Plan of Care  Patient       Patient will benefit from skilled therapeutic intervention in order to improve the following deficits and impairments:  Pain, Postural dysfunction, Increased muscle spasms, Decreased activity tolerance, Decreased strength, Impaired flexibility, Hypomobility  Visit Diagnosis: Cervicalgia  Abnormal posture  Cramp and spasm     Problem List Patient Active Problem List  Diagnosis Date Noted  . Abscess and cellulitis 08/15/2014  . Cellulitis and abscess of leg 08/15/2014  . Abscess 08/15/2014    Lorrene ReidKelly Mishael Krysiak, PT 11/15/17 10:16 AM  [facility] [address], [address] [place], [place], [address] Phone: [phone]   Fax:  [phone]  Name: Ethan MohDaniel Masella MRN: [id number] Date of Birth: 02/14/1971

## 2017-11-15 NOTE — Patient Instructions (Signed)

## 2017-11-16 DIAGNOSIS — F332 Major depressive disorder, recurrent severe without psychotic features: Secondary | ICD-10-CM | POA: Diagnosis not present

## 2017-11-16 DIAGNOSIS — F959 Tic disorder, unspecified: Secondary | ICD-10-CM | POA: Diagnosis not present

## 2017-11-22 ENCOUNTER — Ambulatory Visit: Payer: 59

## 2017-11-22 DIAGNOSIS — M542 Cervicalgia: Secondary | ICD-10-CM

## 2017-11-22 DIAGNOSIS — R252 Cramp and spasm: Secondary | ICD-10-CM | POA: Diagnosis not present

## 2017-11-22 DIAGNOSIS — R293 Abnormal posture: Secondary | ICD-10-CM

## 2017-11-22 NOTE — Therapy (Signed)
[facility] [address] [place], [place], [address] Phone: [phone]   Fax:  [phone]  Physical Therapy Treatment  Patient Details  Name: Ethan Matthews MRN: [id number] Date of Birth: July 14, 1970 Referring Provider: Maryelizabeth Rowan, MD   Encounter Date: 11/22/2017  PT End of Session - 11/22/17 1020    Visit Number  3    Date for PT Re-Evaluation  12/27/17    Authorization Type  UMR    PT Start Time  0933    PT Stop Time  1024    PT Time Calculation (min)  51 min    Activity Tolerance  Patient tolerated treatment well    Behavior During Therapy  [facility] for tasks assessed/performed       History reviewed. No pertinent past medical history.  History reviewed. No pertinent surgical history.  There were no vitals filed for this visit.  Subjective Assessment - 11/22/17 0935    Subjective  I felt a little bit better after dry needling last session.      Currently in Pain?  Yes    Pain Score  5     Pain Location  Neck    Pain Orientation  Right    Pain Descriptors / Indicators  Aching;Burning;Tightness    Pain Onset  More than a month ago    Pain Frequency  Constant    Aggravating Factors   laying on Lt side, turning head with driving    Pain Relieving Factors  not performing the aggravating motion                       [facility] Adult PT Treatment/Exercise - 11/22/17 0001      Exercises   Exercises  Lumbar;Neck      Lumbar Exercises: Seated   Other Seated Lumbar Exercises  cervicothoracic rotation seated 3x20 seconds      Lumbar Exercises: Sidelying   Other Sidelying Lumbar Exercises  open book stretch: 10 each side      Moist Heat Therapy   Number Minutes Moist Heat  10 Minutes    Moist Heat Location  Cervical      Manual Therapy   Manual Therapy  Soft tissue mobilization;Myofascial release    Manual therapy comments  soft tissue elongation Rt>Lt cervical paraspinals, suboccipitals  and upper traps      Neck Exercises: Stretches   Other Neck Stretches  3 ways: 3x20 seconds       Trigger Point Dry Needling - 11/22/17 0945    Consent Given?  Yes    Muscles Treated Upper Body  Upper trapezius;Oblique capitus;Suboccipitals muscle group   bil cervical multifidi   Upper Trapezius Response  Twitch reponse elicited;Palpable increased muscle length    Oblique Capitus Response  Twitch response elicited;Palpable increased muscle length    SubOccipitals Response  Twitch response elicited;Palpable increased muscle length           PT Education - 11/22/17 0948    Education Details  Access Code: [email or web address]    Person(s) Educated  Patient    Methods  Explanation;Demonstration;Handout    Comprehension  Verbalized understanding;Returned demonstration       PT Short Term Goals - 11/01/17 1049      PT SHORT TERM GOAL #1   Title  be independent in initial HEP    Time  4    Period  Weeks    Status  New    Target Date  11/29/17  PT SHORT TERM GOAL #2   Title  demonstrate neutral seated posture > or = to 50% of the time and report postural corrections at home    Time  4    Period  Weeks    Status  New    Target Date  11/29/17      PT SHORT TERM GOAL #3   Title  report a 25% reduction in neck pain and headaches    Time  4    Period  Weeks    Status  New    Target Date  11/29/17        PT Long Term Goals - 11/01/17 1050      PT LONG TERM GOAL #1   Title  be independent in advanced HEP    Time  8    Period  Weeks    Status  New    Target Date  12/27/17      PT LONG TERM GOAL #2   Title  reduce FOTO To < or = to 28% limitation    Time  8    Period  Weeks    Status  New    Target Date  12/27/17      PT LONG TERM GOAL #3   Title  report a 60% reduction in frequency and intensity of headaches and neck pain    Time  8    Period  Weeks    Status  New    Target Date  12/27/17      PT LONG TERM GOAL #4   Title  demonstrate Rt cervical A/ROM rotation  to > or = to 65 degrees to improve safety with driving    Time  8    Period  Weeks    Status  New    Target Date  12/27/17      PT LONG TERM GOAL #5   Title  demonstrate correct seated posture > or = to 75% of the time     Time  8    Period  Weeks    Status  New    Target Date  12/27/17            Plan - 11/22/17 0947    Clinical Impression Statement   Pt reports continued Rt neck pain and headaches. Headaches have reduced to fewer days a week.  Session focused on manual therapy including dry needling and elongation to suboccipitals, paraspinals and upper traps and progression of HEP for thoracic mobility.  Pt with improved movement to the Lt with open book stretch.  Pt requires verbal and tactile cues for posture and scapular depression with cervical stretches.   Pt with tension and trigger points and demonstrated improved tissue mobility after therapy today.  Pt will continue to benefit from skilled PT for manual, cervical A/ROM, postural strength and headache management.       Rehab Potential  Good    PT Frequency  2x / week    PT Duration  8 weeks    PT Treatment/Interventions  ADLs/Self Care Home Management;Cryotherapy;Electrical Stimulation;Ultrasound;Traction;Moist Heat;Functional mobility training;Therapeutic activities;Therapeutic exercise;Patient/family education;Manual techniques;Passive range of motion;Taping;Dry needling    PT Next Visit Plan  dry needling to neck and suboccipitals, postural strength, cervical mobs and flexibility, pain management.  Add thoracic strength in supine    PT Home Exercise Plan  Access Code: [email or web address]    Consulted and Agree with Plan of Care  Patient       Patient will benefit  from skilled therapeutic intervention in order to improve the following deficits and impairments:  Pain, Postural dysfunction, Increased muscle spasms, Decreased activity tolerance, Decreased strength, Impaired flexibility, Hypomobility  Visit  Diagnosis: Cervicalgia  Abnormal posture  Cramp and spasm     Problem List Patient Active Problem List   Diagnosis Date Noted  . Abscess and cellulitis 08/15/2014  . Cellulitis and abscess of leg 08/15/2014  . Abscess 08/15/2014    Lorrene Reid, PT 11/22/17 10:24 AM  [facility] [address], [address] [place], [place], [address] Phone: [phone]   Fax:  [phone]  Name: Ethan Matthews MRN: [id number] Date of Birth: 1970-11-24

## 2017-11-29 ENCOUNTER — Ambulatory Visit: Payer: 59 | Attending: Family Medicine

## 2017-11-29 DIAGNOSIS — R293 Abnormal posture: Secondary | ICD-10-CM | POA: Insufficient documentation

## 2017-11-29 DIAGNOSIS — F411 Generalized anxiety disorder: Secondary | ICD-10-CM | POA: Diagnosis not present

## 2017-11-29 DIAGNOSIS — R252 Cramp and spasm: Secondary | ICD-10-CM | POA: Insufficient documentation

## 2017-11-29 DIAGNOSIS — M542 Cervicalgia: Secondary | ICD-10-CM | POA: Insufficient documentation

## 2017-11-29 DIAGNOSIS — Z23 Encounter for immunization: Secondary | ICD-10-CM | POA: Diagnosis not present

## 2017-11-29 DIAGNOSIS — F331 Major depressive disorder, recurrent, moderate: Secondary | ICD-10-CM | POA: Diagnosis not present

## 2017-11-29 NOTE — Patient Instructions (Signed)
Access Code: [email or web address]  URL: [email or web address]  Date: 11/29/2017  Prepared by: Lorrene Reid   Exercises  Supine Shoulder Horizontal Abduction with Resistance - 10 reps - 2 sets - 2x daily - 7x weekly  Supine Bilateral Shoulder External Rotation with Resistance - 10 reps - 2 sets - 2x daily - 7x weekly  Supine PNF D2 Flexion with Resistance - 10 reps - 2 sets - 2x daily - 7x weekly

## 2017-11-29 NOTE — Therapy (Signed)
[facility] [address] [place], [place], [address] Phone: [phone]   Fax:  [phone]  Physical Therapy Treatment  Patient Details  Name: Ethan Matthews MRN: [id number] Date of Birth: November 23, 1970 Referring Provider: Maryelizabeth Rowan, MD   Encounter Date: 11/29/2017  PT End of Session - 11/29/17 1016    Visit Number  4    Date for PT Re-Evaluation  12/27/17    Authorization Type  UMR    PT Start Time  0937    PT Stop Time  1016    PT Time Calculation (min)  39 min    Activity Tolerance  Patient tolerated treatment well    Behavior During Therapy  [facility] for tasks assessed/performed       History reviewed. No pertinent past medical history.  History reviewed. No pertinent surgical history.  There were no vitals filed for this visit.  Subjective Assessment - 11/29/17 0944    Subjective  The spasms are the same.  I just saw Dr Duanne Guess and she thinks I am stressed and increased my medication.      Pertinent History  involuntary twitch    Currently in Pain?  Yes    Pain Score  4     Pain Location  Neck    Pain Orientation  Right    Pain Descriptors / Indicators  Aching;Burning;Tightness    Pain Type  Chronic pain    Pain Onset  More than a month ago    Pain Frequency  Constant    Aggravating Factors   stress, turning head with driving    Pain Relieving Factors  not performing the aggravating motion, stretching                       [facility] Adult PT Treatment/Exercise - 11/29/17 0001      Exercises   Exercises  Lumbar;Neck      Neck Exercises: Machines for Strengthening   UBE (Upper Arm Bike)  Level 2 x 6 minutes (3/3)   PT present to discuss progress     Lumbar Exercises: Supine   Other Supine Lumbar Exercises  supine on foam roll: decompression x 3 minutes, horizontal abduction, D2 and ER  with green theraband      Lumbar Exercises: Sidelying   Other Sidelying Lumbar Exercises  open  book stretch: 10 each side      Manual Therapy   Manual Therapy  Joint mobilization    Manual therapy comments  PA mobs to thoracic spine grade 2-3             PT Education - 11/29/17 1002    Education Details   Access Code: [email or web address]     Person(s) Educated  Patient    Methods  Explanation;Demonstration;Handout    Comprehension  Verbalized understanding;Returned demonstration       PT Short Term Goals - 11/29/17 1003      PT SHORT TERM GOAL #1   Title  be independent in initial HEP    Status  Achieved      PT SHORT TERM GOAL #2   Title  demonstrate neutral seated posture > or = to 50% of the time and report postural corrections at home    Baseline  working on this    Time  4    Period  Weeks    Status  On-going      PT SHORT TERM GOAL #3   Title  report a  25% reduction in neck pain and headaches    Baseline  20% less frequent    Time  4    Period  Weeks    Status  On-going        PT Long Term Goals - 11/01/17 1050      PT LONG TERM GOAL #1   Title  be independent in advanced HEP    Time  8    Period  Weeks    Status  New    Target Date  12/27/17      PT LONG TERM GOAL #2   Title  reduce FOTO To < or = to 28% limitation    Time  8    Period  Weeks    Status  New    Target Date  12/27/17      PT LONG TERM GOAL #3   Title  report a 60% reduction in frequency and intensity of headaches and neck pain    Time  8    Period  Weeks    Status  New    Target Date  12/27/17      PT LONG TERM GOAL #4   Title  demonstrate Rt cervical A/ROM rotation to > or = to 65 degrees to improve safety with driving    Time  8    Period  Weeks    Status  New    Target Date  12/27/17      PT LONG TERM GOAL #5   Title  demonstrate correct seated posture > or = to 75% of the time     Time  8    Period  Weeks    Status  New    Target Date  12/27/17            Plan - 11/29/17 1005    Clinical Impression Statement  Pt with continued neck pain and spasms.  Pt has  a lot of stress and MD has increased his medicine to help with this.  Pt with reduced thoracic mobility and pain with PA mobs today.  PT advanced HEP for postural strength today.  Pt reports 20% reduction in headaches.  Pt only attends 1x/wk so progress has been slow.  Pt will continue to benefit from skilled PT for strength, flexibility and manual to improve segmental mobility and muscle spasms.      Rehab Potential  Good    PT Frequency  2x / week    PT Duration  8 weeks    PT Treatment/Interventions  ADLs/Self Care Home Management;Cryotherapy;Electrical Stimulation;Ultrasound;Traction;Moist Heat;Functional mobility training;Therapeutic activities;Therapeutic exercise;Patient/family education;Manual techniques;Passive range of motion;Taping;Dry needling    PT Next Visit Plan  dry needling to neck and suboccipitals, postural strength, cervical mobs and flexibility, pain management.  review thoracic strength in supine    PT Home Exercise Plan  Access Code: [email or web address]    Consulted and Agree with Plan of Care  Patient       Patient will benefit from skilled therapeutic intervention in order to improve the following deficits and impairments:  Pain, Postural dysfunction, Increased muscle spasms, Decreased activity tolerance, Decreased strength, Impaired flexibility, Hypomobility  Visit Diagnosis: Cervicalgia  Abnormal posture  Cramp and spasm     Problem List Patient Active Problem List   Diagnosis Date Noted  . Abscess and cellulitis 08/15/2014  . Cellulitis and abscess of leg 08/15/2014  . Abscess 08/15/2014     Lorrene Reid, PT 11/29/17 10:20 AM  [facility]  [address] [place], [place], [address] Phone: [phone]   Fax:  [phone]  Name: Shiv Lauer MRN: [id number] Date of Birth: 1970/06/08

## 2017-12-13 ENCOUNTER — Other Ambulatory Visit: Payer: Self-pay | Admitting: Family Medicine

## 2017-12-13 ENCOUNTER — Ambulatory Visit: Payer: 59

## 2017-12-13 ENCOUNTER — Ambulatory Visit
Admission: RE | Admit: 2017-12-13 | Discharge: 2017-12-13 | Disposition: A | Discharge status: Home / Self Care | Payer: 59 | Source: Ambulatory Visit | Attending: Family Medicine | Admitting: Family Medicine

## 2017-12-13 DIAGNOSIS — R252 Cramp and spasm: Secondary | ICD-10-CM

## 2017-12-13 DIAGNOSIS — Z6829 Body mass index (BMI) 29.0-29.9, adult: Secondary | ICD-10-CM | POA: Diagnosis not present

## 2017-12-13 DIAGNOSIS — R52 Pain, unspecified: Secondary | ICD-10-CM

## 2017-12-13 DIAGNOSIS — M5412 Radiculopathy, cervical region: Secondary | ICD-10-CM | POA: Diagnosis not present

## 2017-12-13 DIAGNOSIS — M542 Cervicalgia: Secondary | ICD-10-CM | POA: Diagnosis not present

## 2017-12-13 DIAGNOSIS — R293 Abnormal posture: Secondary | ICD-10-CM

## 2017-12-13 DIAGNOSIS — M50121 Cervical disc disorder at C4-C5 level with radiculopathy: Secondary | ICD-10-CM | POA: Diagnosis not present

## 2017-12-13 NOTE — Therapy (Addendum)
[facility] [address] [place], [place], [address] Phone: [phone]   Fax:  [phone]  Physical Therapy Treatment  Patient Details  Name: Rosco Harriott MRN: [id number] Date of Birth: [birth year] Referring Provider: Rachell Cipro, MD   Encounter Date: 12/13/2017  PT End of Session - 12/13/17 1012    Visit Number  5    Date for PT Re-Evaluation  12/27/17    Authorization Type  UMR    PT Start Time  0937    PT Stop Time  1026    PT Time Calculation (min)  49 min    Activity Tolerance  Patient tolerated treatment well    Behavior During Therapy  [facility] for tasks assessed/performed       History reviewed. No pertinent past medical history.  History reviewed. No pertinent surgical history.  There were no vitals filed for this visit.  Subjective Assessment - 12/13/17 0939    Subjective  I am not doing good.  My neck is cramping when I turn my neck.      Currently in Pain?  Yes    Pain Score  7     Pain Location  Neck    Pain Orientation  Right    Pain Descriptors / Indicators  Aching;Burning;Tightness    Pain Type  Chronic pain    Pain Onset  More than a month ago    Pain Frequency  Constant    Aggravating Factors   turning Rt, stress    Pain Relieving Factors  turning to the Lt, massaging the neck, heat         [facility] PT Assessment - 12/13/17 0001      Assessment   Medical Diagnosis  neck pain      Observation/Other Assessments   Focus on Therapeutic Outcomes (FOTO)   51% limitation      AROM   Cervical - Right Side Bend  35    Cervical - Left Side Bend  20                   [facility] Adult PT Treatment/Exercise - 12/13/17 0001      Modalities   Modalities  Electrical Stimulation      Moist Heat Therapy   Number Minutes Moist Heat  15 Minutes    Moist Heat Location  Cervical      Electrical Stimulation   Electrical Stimulation Location  bil neck    Electrical Stimulation  Action  IFC    Electrical Stimulation Parameters  15 minutes    Electrical Stimulation Goals  Pain      Manual Therapy   Manual Therapy  Joint mobilization;Soft tissue mobilization    Manual therapy comments  side glide to the Lt C4-6 grade 3 with passive motion into cervical sidebending.  Soft tissue elongation to bil neck and upper traps Rt>Lt               PT Short Term Goals - 11/29/17 1003      PT SHORT TERM GOAL #1   Title  be independent in initial HEP    Status  Achieved      PT SHORT TERM GOAL #2   Title  demonstrate neutral seated posture > or = to 50% of the time and report postural corrections at home    Baseline  working on this    Time  4    Period  Weeks    Status  On-going  PT SHORT TERM GOAL #3   Title  report a 25% reduction in neck pain and headaches    Baseline  20% less frequent    Time  4    Period  Weeks    Status  On-going        PT Long Term Goals - 11/01/17 1050      PT LONG TERM GOAL #1   Title  be independent in advanced HEP    Time  8    Period  Weeks    Status  New    Target Date  12/27/17      PT LONG TERM GOAL #2   Title  reduce FOTO To < or = to 28% limitation    Time  8    Period  Weeks    Status  New    Target Date  12/27/17      PT LONG TERM GOAL #3   Title  report a 60% reduction in frequency and intensity of headaches and neck pain    Time  8    Period  Weeks    Status  New    Target Date  12/27/17      PT LONG TERM GOAL #4   Title  demonstrate Rt cervical A/ROM rotation to > or = to 65 degrees to improve safety with driving    Time  8    Period  Weeks    Status  New    Target Date  12/27/17      PT LONG TERM GOAL #5   Title  demonstrate correct seated posture > or = to 75% of the time     Time  8    Period  Weeks    Status  New    Target Date  12/27/17            Plan - 12/13/17 1011    Clinical Impression Statement  Pt with overall pain today due to RA flare up.  Pt will receive injection  for RA tomorrow. Pt with pain overall improved by 90%  since the start of care.  Pt with tension and reduced mobility in the cervical spine and upper thoracic spine and demonstrated improved tissue mobility and improved cervical A/ROM s/p dry needling today. FOTO is improved from 40% limitation to 30% limitation. PT provided tactile and verbal cues for seated exercise today to reduce scapular elevation.  Pt is ready for D/C and will discharge to HEP today.      Rehab Potential  Good    PT Frequency  2x / week    PT Duration  8 weeks    PT Treatment/Interventions  ADLs/Self Care Home Management;Cryotherapy;Electrical Stimulation;Ultrasound;Traction;Moist Heat;Functional mobility training;Therapeutic activities;Therapeutic exercise;Patient/family education;Manual techniques;Passive range of motion;Taping;Dry needling    PT Next Visit Plan  see what MD says regarding lack of progress.  Address cervical immobility and muscle spasms and posture if pt returns.     PT Home Exercise Plan  Access Code: [email or web address]    Consulted and Agree with Plan of Care  Patient       Patient will benefit from skilled therapeutic intervention in order to improve the following deficits and impairments:  Pain, Postural dysfunction, Increased muscle spasms, Decreased activity tolerance, Decreased strength, Impaired flexibility, Hypomobility  Visit Diagnosis: Cervicalgia  Abnormal posture  Cramp and spasm     Problem List Patient Active Problem List   Diagnosis Date Noted  . Abscess and cellulitis 08/15/2014  . Cellulitis and  abscess of leg 08/15/2014  . Abscess 08/15/2014    Sigurd Sos, PT 12/13/17 10:13 AM PHYSICAL THERAPY DISCHARGE SUMMARY  Visits from Start of Care: 5  Current functional level related to goals / functional outcomes: See above for current status.  Pt didn't return after 12/13/17 and at that time was continuing to have significant pain and planned to follow-up with MD.   Remaining  deficits: See above for most recent PT status.     Education / Equipment: HEP, posture/body mechanics Plan: Patient agrees to discharge.  Patient goals were partially met. Patient is being discharged due to the patient's request.  ?????        Sigurd Sos, PT 01/03/18 9:10 AM   [facility] [address] [place], [place], [address] Phone: [phone]   Fax:  [phone]  Name: Griselda Tosh MRN: [id number] Date of Birth: [birth year]

## 2017-12-20 ENCOUNTER — Ambulatory Visit: Payer: 59

## 2017-12-20 ENCOUNTER — Telehealth: Payer: Self-pay

## 2017-12-20 NOTE — Telephone Encounter (Signed)
PT called pt due to no show today.  Left message on voicemail for pt to return call.

## 2017-12-27 ENCOUNTER — Ambulatory Visit: Payer: Self-pay

## 2017-12-27 DIAGNOSIS — M5412 Radiculopathy, cervical region: Secondary | ICD-10-CM | POA: Diagnosis not present

## 2018-01-04 DIAGNOSIS — M5412 Radiculopathy, cervical region: Secondary | ICD-10-CM | POA: Diagnosis not present

## 2018-01-04 DIAGNOSIS — M542 Cervicalgia: Secondary | ICD-10-CM | POA: Diagnosis not present

## 2018-01-10 DIAGNOSIS — M47812 Spondylosis without myelopathy or radiculopathy, cervical region: Secondary | ICD-10-CM | POA: Diagnosis not present

## 2018-01-10 DIAGNOSIS — Z6828 Body mass index (BMI) 28.0-28.9, adult: Secondary | ICD-10-CM | POA: Diagnosis not present

## 2018-01-10 DIAGNOSIS — M5412 Radiculopathy, cervical region: Secondary | ICD-10-CM | POA: Diagnosis not present

## 2018-01-10 DIAGNOSIS — R030 Elevated blood-pressure reading, without diagnosis of hypertension: Secondary | ICD-10-CM | POA: Diagnosis not present

## 2018-01-31 DIAGNOSIS — M5412 Radiculopathy, cervical region: Secondary | ICD-10-CM | POA: Diagnosis not present

## 2018-03-07 DIAGNOSIS — R739 Hyperglycemia, unspecified: Secondary | ICD-10-CM | POA: Diagnosis not present

## 2018-03-07 DIAGNOSIS — M47812 Spondylosis without myelopathy or radiculopathy, cervical region: Secondary | ICD-10-CM | POA: Diagnosis not present

## 2018-03-07 DIAGNOSIS — E782 Mixed hyperlipidemia: Secondary | ICD-10-CM | POA: Diagnosis not present

## 2018-03-08 DIAGNOSIS — F959 Tic disorder, unspecified: Secondary | ICD-10-CM | POA: Diagnosis not present

## 2018-03-08 DIAGNOSIS — F332 Major depressive disorder, recurrent severe without psychotic features: Secondary | ICD-10-CM | POA: Diagnosis not present

## 2018-03-09 DIAGNOSIS — R739 Hyperglycemia, unspecified: Secondary | ICD-10-CM | POA: Diagnosis not present

## 2018-03-09 DIAGNOSIS — E663 Overweight: Secondary | ICD-10-CM | POA: Diagnosis not present

## 2018-03-09 DIAGNOSIS — E782 Mixed hyperlipidemia: Secondary | ICD-10-CM | POA: Diagnosis not present

## 2018-03-09 DIAGNOSIS — Z6830 Body mass index (BMI) 30.0-30.9, adult: Secondary | ICD-10-CM | POA: Diagnosis not present

## 2018-07-18 DIAGNOSIS — B353 Tinea pedis: Secondary | ICD-10-CM | POA: Diagnosis not present

## 2018-07-18 DIAGNOSIS — M542 Cervicalgia: Secondary | ICD-10-CM | POA: Diagnosis not present

## 2018-07-18 DIAGNOSIS — Z719 Counseling, unspecified: Secondary | ICD-10-CM | POA: Diagnosis not present

## 2018-07-23 DIAGNOSIS — F331 Major depressive disorder, recurrent, moderate: Secondary | ICD-10-CM | POA: Diagnosis not present

## 2018-09-14 DIAGNOSIS — E782 Mixed hyperlipidemia: Secondary | ICD-10-CM | POA: Diagnosis not present

## 2018-09-17 DIAGNOSIS — B353 Tinea pedis: Secondary | ICD-10-CM | POA: Diagnosis not present

## 2018-09-17 DIAGNOSIS — F411 Generalized anxiety disorder: Secondary | ICD-10-CM | POA: Diagnosis not present

## 2018-09-17 DIAGNOSIS — E782 Mixed hyperlipidemia: Secondary | ICD-10-CM | POA: Diagnosis not present

## 2018-12-16 IMAGING — DX DG CERVICAL SPINE WITH FLEX & EXTEND
8 series · 8 of 8 positions shown · non-contrast
Comparison: None.

CLINICAL DATA: Neck pain radiating down right shoulder for 2
months. No known injury.

EXAM:
CERVICAL SPINE COMPLETE WITH FLEXION AND EXTENSION VIEWS

[dg cervical spine with flex & extend (1 of 8)]
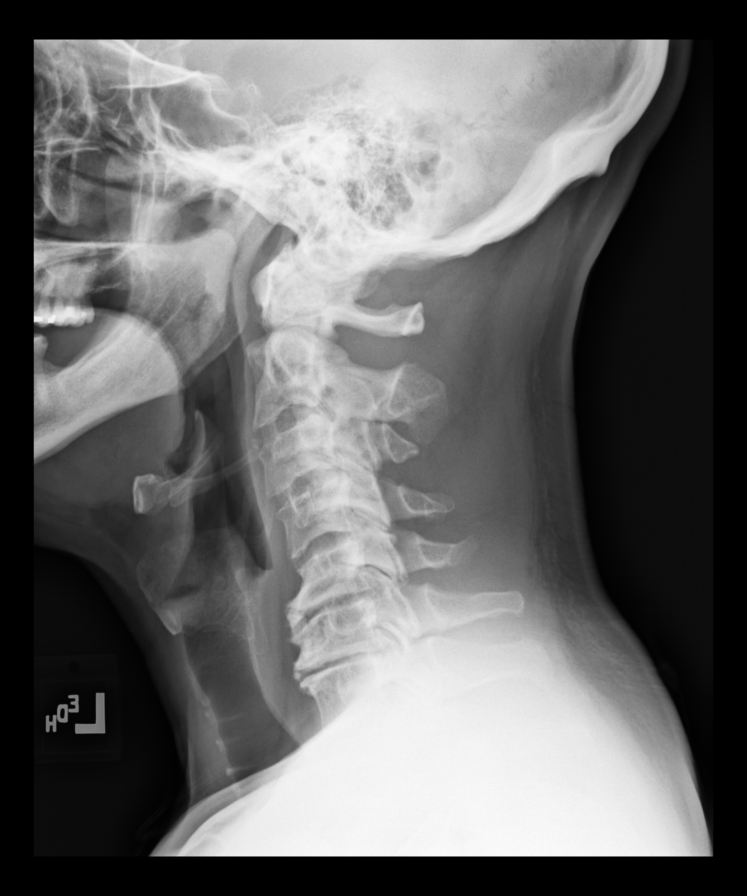

[dg cervical spine with flex & extend (2 of 8)]
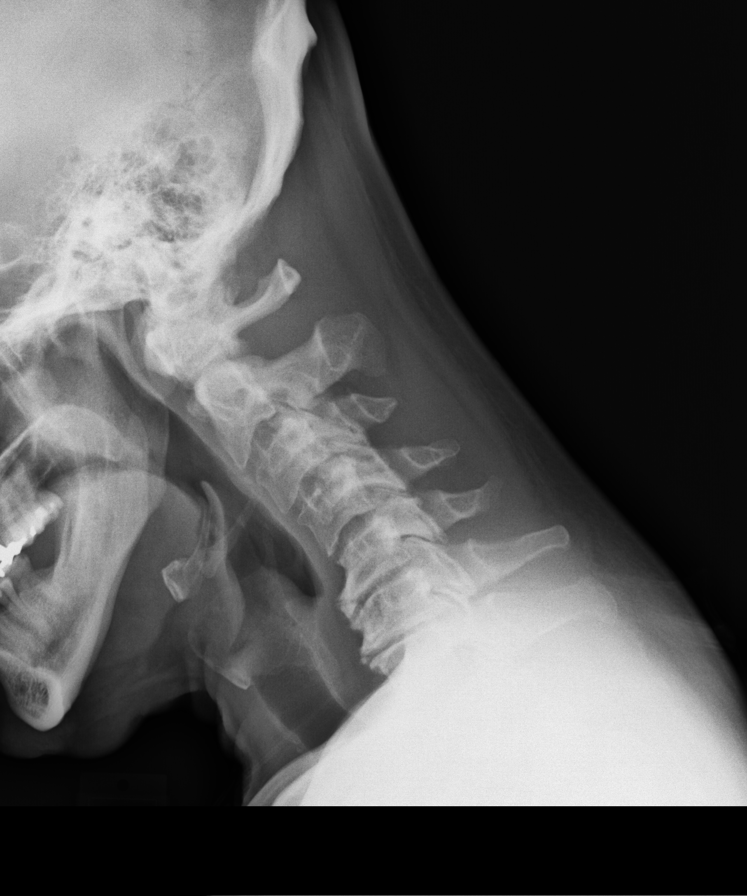

[dg cervical spine with flex & extend (3 of 8)]
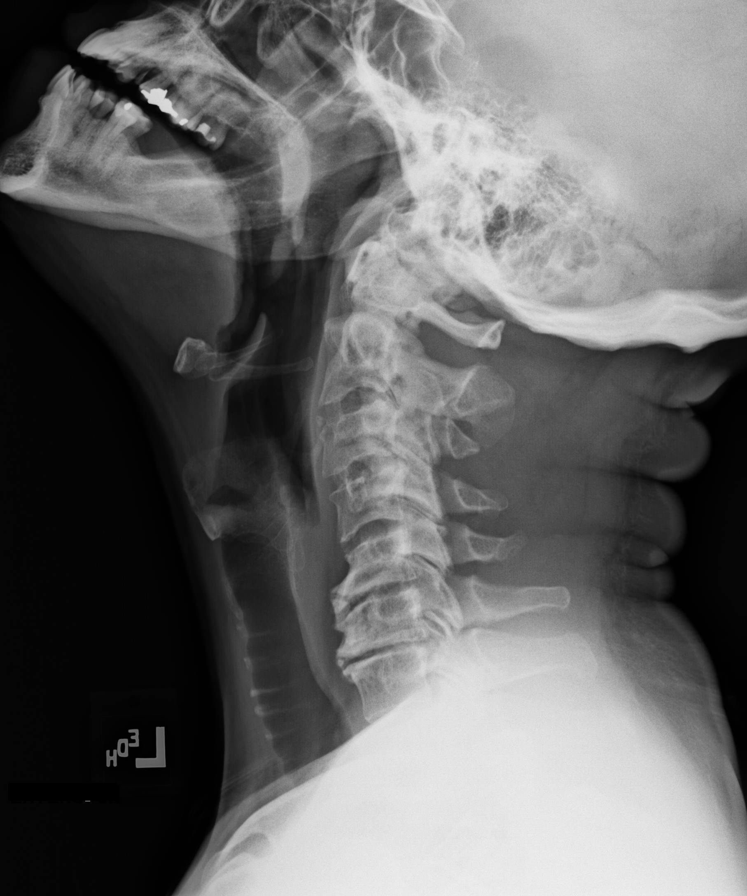

[dg cervical spine with flex & extend (4 of 8)]
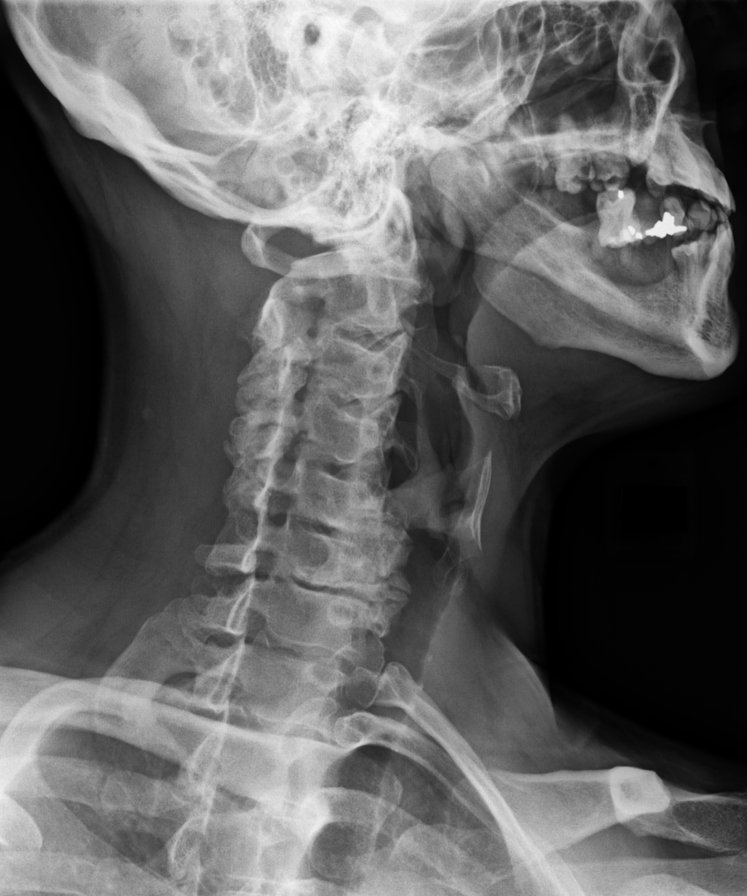

[dg cervical spine with flex & extend (5 of 8)]
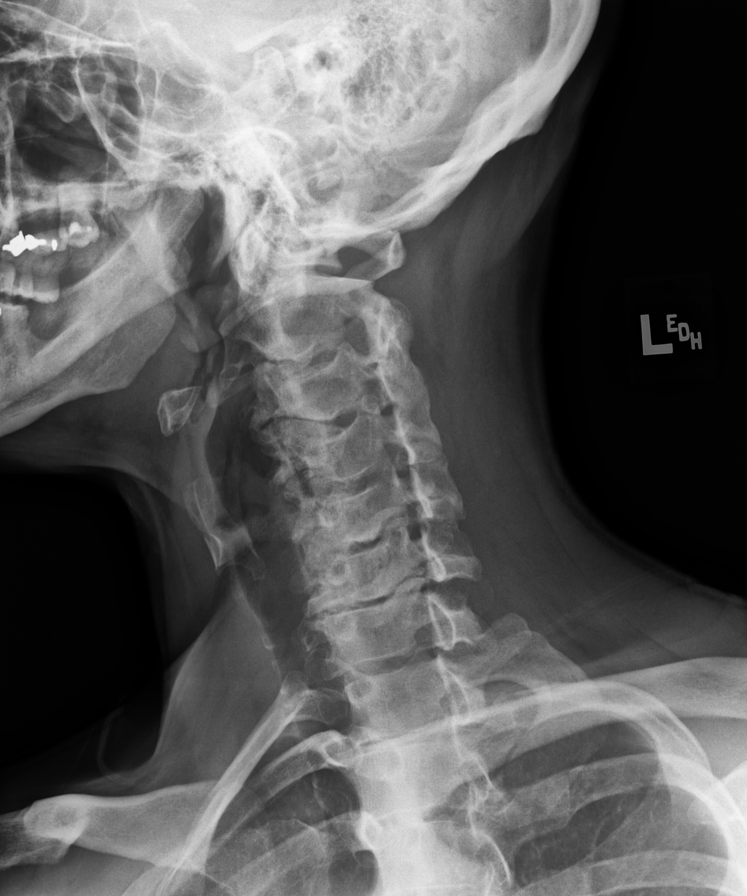

[dg cervical spine with flex & extend (6 of 8)]
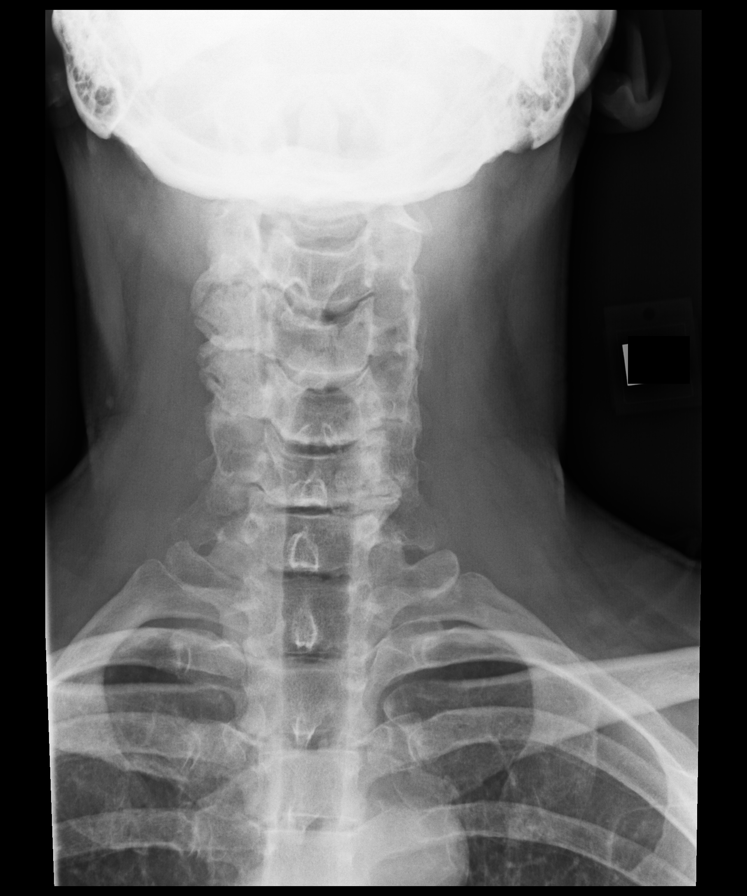

[dg cervical spine with flex & extend (7 of 8)]
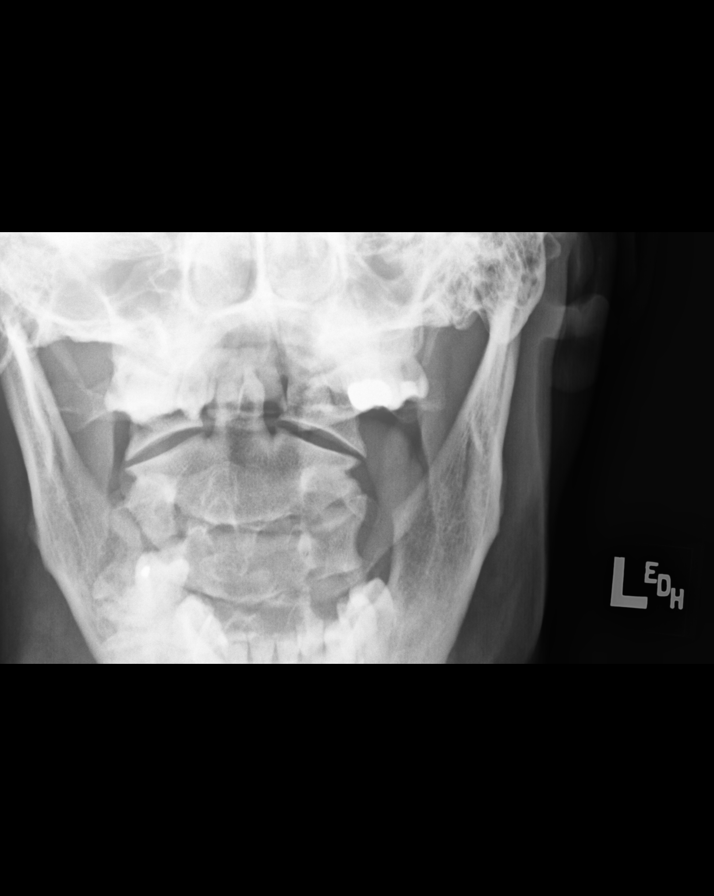

[dg cervical spine with flex & extend (8 of 8)]
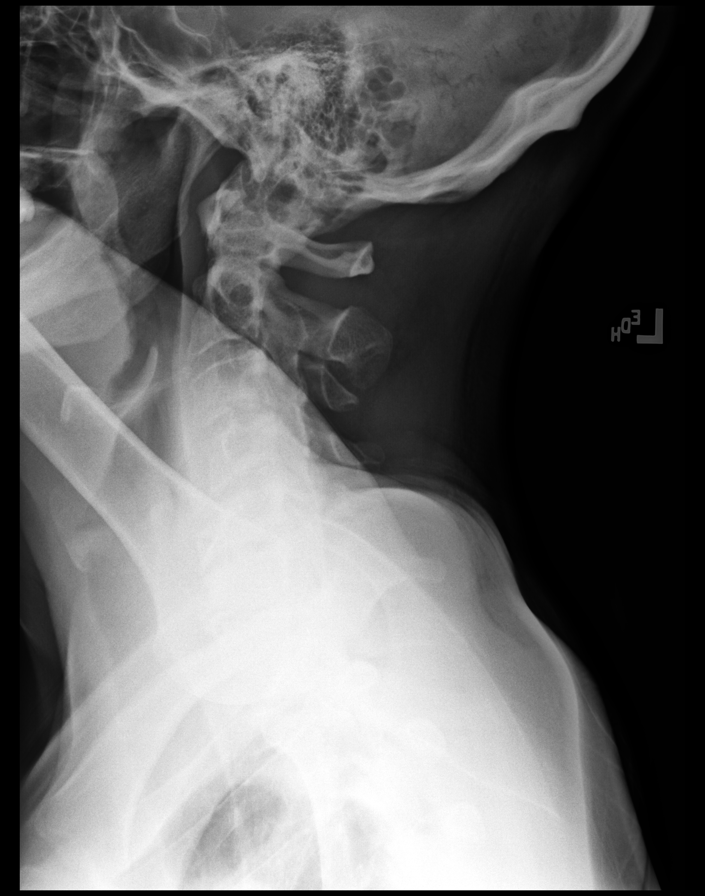

[8 of 8 positions shown; findings below may reference images not displayed]

FINDINGS: Diffuse degenerative disc and facet disease throughout the cervical
spine. No fracture or subluxation. No instability with flexion or
extension. Only the left oblique is submitted demonstrating multi
level neural foraminal narrowing, most pronounced at C3-4, C5-6 and
C6-7.
IMPRESSION: Diffuse degenerative disc disease and facet disease. No instability
or acute bony abnormality.

## 2019-01-14 DIAGNOSIS — F959 Tic disorder, unspecified: Secondary | ICD-10-CM | POA: Diagnosis not present

## 2019-01-14 DIAGNOSIS — F332 Major depressive disorder, recurrent severe without psychotic features: Secondary | ICD-10-CM | POA: Diagnosis not present

## 2019-02-26 DIAGNOSIS — Z1159 Encounter for screening for other viral diseases: Secondary | ICD-10-CM | POA: Diagnosis not present

## 2019-02-26 DIAGNOSIS — J069 Acute upper respiratory infection, unspecified: Secondary | ICD-10-CM | POA: Diagnosis not present

## 2019-03-08 DIAGNOSIS — Z1159 Encounter for screening for other viral diseases: Secondary | ICD-10-CM | POA: Diagnosis not present

## 2019-03-11 DIAGNOSIS — H6691 Otitis media, unspecified, right ear: Secondary | ICD-10-CM | POA: Diagnosis not present

## 2019-03-11 DIAGNOSIS — U071 COVID-19: Secondary | ICD-10-CM | POA: Diagnosis not present

## 2019-03-11 DIAGNOSIS — R5383 Other fatigue: Secondary | ICD-10-CM | POA: Diagnosis not present

## 2019-03-13 DIAGNOSIS — U071 COVID-19: Secondary | ICD-10-CM | POA: Diagnosis not present

## 2019-03-13 DIAGNOSIS — E782 Mixed hyperlipidemia: Secondary | ICD-10-CM | POA: Diagnosis not present

## 2019-03-13 DIAGNOSIS — F411 Generalized anxiety disorder: Secondary | ICD-10-CM | POA: Diagnosis not present

## 2019-05-16 DIAGNOSIS — F411 Generalized anxiety disorder: Secondary | ICD-10-CM | POA: Diagnosis not present

## 2019-05-16 DIAGNOSIS — R5383 Other fatigue: Secondary | ICD-10-CM | POA: Diagnosis not present

## 2019-05-16 DIAGNOSIS — M542 Cervicalgia: Secondary | ICD-10-CM | POA: Diagnosis not present

## 2020-02-09 DIAGNOSIS — J029 Acute pharyngitis, unspecified: Secondary | ICD-10-CM | POA: Diagnosis not present

## 2020-02-09 DIAGNOSIS — J069 Acute upper respiratory infection, unspecified: Secondary | ICD-10-CM | POA: Diagnosis not present

## 2020-03-02 DIAGNOSIS — J209 Acute bronchitis, unspecified: Secondary | ICD-10-CM | POA: Diagnosis not present

## 2020-03-30 DIAGNOSIS — R918 Other nonspecific abnormal finding of lung field: Secondary | ICD-10-CM | POA: Diagnosis not present

## 2020-03-30 DIAGNOSIS — Z20822 Contact with and (suspected) exposure to covid-19: Secondary | ICD-10-CM | POA: Diagnosis not present

## 2020-03-30 DIAGNOSIS — R058 Other specified cough: Secondary | ICD-10-CM | POA: Diagnosis not present

## 2021-08-31 ENCOUNTER — Emergency Department (INDEPENDENT_AMBULATORY_CARE_PROVIDER_SITE_OTHER): Payer: No Typology Code available for payment source

## 2021-08-31 ENCOUNTER — Encounter: Payer: Self-pay | Admitting: Emergency Medicine

## 2021-08-31 ENCOUNTER — Emergency Department (INDEPENDENT_AMBULATORY_CARE_PROVIDER_SITE_OTHER)
Admission: EM | Admit: 2021-08-31 | Discharge: 2021-08-31 | Disposition: A | Discharge status: Home / Self Care | Payer: No Typology Code available for payment source | Source: Home / Self Care

## 2021-08-31 DIAGNOSIS — R079 Chest pain, unspecified: Secondary | ICD-10-CM | POA: Diagnosis not present

## 2021-08-31 DIAGNOSIS — J189 Pneumonia, unspecified organism: Secondary | ICD-10-CM | POA: Diagnosis not present

## 2021-08-31 DIAGNOSIS — R059 Cough, unspecified: Secondary | ICD-10-CM | POA: Diagnosis not present

## 2021-08-31 DIAGNOSIS — R509 Fever, unspecified: Secondary | ICD-10-CM

## 2021-08-31 LAB — POC SARS CORONAVIRUS 2 AG -  ED: SARS Coronavirus 2 Ag: NEGATIVE

## 2021-08-31 MED ORDER — AMOXICILLIN 500 MG PO CAPS: 1000.0000 mg | ORAL_CAPSULE | Freq: Three times a day (TID) | ORAL | 0 refills | Status: AC

## 2021-08-31 MED ORDER — DOXYCYCLINE HYCLATE 100 MG PO CAPS: 100.0000 mg | ORAL_CAPSULE | Freq: Two times a day (BID) | ORAL | 0 refills | Status: AC

## 2021-08-31 NOTE — Discharge Instructions (Addendum)
Your covid test is negative. You have a pneumonia to the left upper lobe of your lung. We will start dual therapy with antibiotics - doxycycline and amoxicillin. Please take all the antibiotics Call your PCP and schedule a repeat chest xray 8-12 weeks from now. Return to our office or head to the ER if fevers develop, shortness of breath or any new or worsening symptoms. Please remain hydrated and continue taking mucinex.  Do not take it within two hours of milk consumption, and avoid multivitamins while on the antibiotic. Please avoid excessive sun exposure or tanning beds while taking. Drink plenty of water.

## 2021-08-31 NOTE — ED Provider Notes (Signed)
[name]    CSN: [id number] Arrival date & time: 08/31/21  0802      History   Chief Complaint Chief Complaint  Patient presents with   Cough    HPI Ethan Matthews is a 51 y.o. male.   51 year old male presents today with a 10 day history of cough, tightness of chest and nasal congestion. Prior to illness, pt states he renovated his home, was not wearing a mask and was exposed to a lot of dust and particles. He has been coughing up thick green and yellow mucous for a week and a half. He states over the past three days his chest has been burning substernally with coughing. Has been taking mucinex DM without sx resolution. Pt denies any chronic lung disease and does not smoke. He had a low grade temp of 99.5 last evening. He states he feels "wiped out." Took a covid test at home on the first day of symptoms which was negative. Denies headache, ear pain, sinus pain, sore throat. Had covid one year ago which resulted in pneumonia, pt states he feels similar to that.    Cough Associated symptoms: fever and shortness of breath    History reviewed. No pertinent past medical history.  Patient Active Problem List   Diagnosis Date Noted   Abscess and cellulitis 08/15/2014   Cellulitis and abscess of leg 08/15/2014   Abscess 08/15/2014    History reviewed. No pertinent surgical history.     Home Medications    Prior to Admission medications   Medication Sig Start Date End Date Taking? Authorizing Provider  amoxicillin (AMOXIL) 500 MG capsule Take 2 capsules (1,000 mg total) by mouth 3 (three) times daily for 7 days. 08/31/21 09/07/21 Yes Bobbyjoe Pabst L, PA  doxycycline (VIBRAMYCIN) 100 MG capsule Take 1 capsule (100 mg total) by mouth 2 (two) times daily for 7 days. 08/31/21 09/07/21 Yes Kathyrn Warmuth L, PA  ibuprofen (ADVIL,MOTRIN) 200 MG tablet Take 400 mg by mouth every 6 (six) hours as needed for moderate pain.    [provider]  Multiple Vitamins-Minerals  (MULTIVITAMIN MEN PO) Take 1 tablet by mouth daily.    [provider]  simvastatin (ZOCOR) 5 MG tablet Take 5 mg by mouth at bedtime. 08/27/21   [provider]  venlafaxine XR (EFFEXOR-XR) 37.5 MG 24 hr capsule Take by mouth. 05/12/16   [provider]    Family History Family History  Problem Relation Age of Onset   Diabetes type II Father     Social History Social History   Tobacco Use   Smoking status: Never   Smokeless tobacco: Never  Vaping Use   Vaping Use: Never used  Substance Use Topics   Alcohol use: No   Drug use: No     Allergies   Patient has no known allergies.   Review of Systems Review of Systems  Constitutional:  Positive for fever.  Respiratory:  Positive for cough, chest tightness and shortness of breath.     Physical Exam Triage Vital Signs ED Triage Vitals  Enc Vitals Group     BP 08/31/21 0816 121/82     Pulse Rate 08/31/21 0816 88     Resp 08/31/21 0816 18     Temp 08/31/21 0816 98.3 F (36.8 C)     Temp Source 08/31/21 0816 Oral     SpO2 08/31/21 0816 95 %     Weight --      Height --  Head Circumference --      Peak Flow --      Pain Score 08/31/21 0818 0     Pain Loc --      Pain Edu? --      Excl. in GC? --    No data found.  Updated Vital Signs BP 121/82 (BP Location: Right Arm)   Pulse 88   Temp 98.3 F (36.8 C) (Oral)   Resp 18   SpO2 95%   Visual Acuity Right Eye Distance:   Left Eye Distance:   Bilateral Distance:    Right Eye Near:   Left Eye Near:    Bilateral Near:     Physical Exam Vitals and nursing note reviewed.  Constitutional:      General: He is not in acute distress.    Appearance: Normal appearance. He is well-developed and normal weight. He is not toxic-appearing or diaphoretic.  HENT:     Head: Normocephalic and atraumatic.     Right Ear: Tympanic membrane, ear canal and external ear normal. There is no impacted cerumen.     Left Ear: Tympanic membrane, ear  canal and external ear normal. There is no impacted cerumen.     Nose: Rhinorrhea present. No congestion.     Mouth/Throat:     Mouth: Mucous membranes are moist.     Pharynx: Oropharynx is clear. No oropharyngeal exudate or posterior oropharyngeal erythema.  Eyes:     General:        Right eye: No discharge.        Left eye: No discharge.     Extraocular Movements: Extraocular movements intact.     Conjunctiva/sclera: Conjunctivae normal.     Pupils: Pupils are equal, round, and reactive to light.  Cardiovascular:     Rate and Rhythm: Normal rate and regular rhythm.     Heart sounds: Normal heart sounds. No murmur heard.   No friction rub. No gallop.  Pulmonary:     Effort: Pulmonary effort is normal. No respiratory distress.     Breath sounds: No stridor. Rhonchi present. No wheezing or rales.     Comments: Slightly diminished breath sounds bibasilar posterior lung fields only. Rhonchi to LUL cleared with coughing Chest:     Chest wall: No tenderness.  Abdominal:     Palpations: Abdomen is soft.     Tenderness: There is no abdominal tenderness.  Musculoskeletal:        General: No swelling.     Cervical back: Normal range of motion and neck supple. No rigidity or tenderness.  Lymphadenopathy:     Cervical: No cervical adenopathy.  Skin:    General: Skin is warm and dry.     Coloration: Skin is not jaundiced.     Findings: No erythema or rash.  Neurological:     General: No focal deficit present.     Mental Status: He is alert and oriented to person, place, and time.  Psychiatric:        Mood and Affect: Mood normal.     UC Treatments / Results  Labs (all labs ordered are listed, but only abnormal results are displayed) Labs Reviewed  POC SARS CORONAVIRUS 2 AG -  ED    EKG   Radiology DG Chest 2 View  Result Date: 08/31/2021 CLINICAL DATA:  10 day hx of cough, tightness of chest, low grade fever, hx of pneumonia one year ago, non-smoker. Sx started after  renovating house EXAM: CHEST - 2 VIEW COMPARISON:  Radiograph 10/29/2017 FINDINGS: Unchanged cardiomediastinal silhouette. There is new airspace disease in the left upper lung. There is no pleural effusion. No pneumothorax. There is no acute osseous abnormality. IMPRESSION: Left upper lung pneumonia. Recommend follow-up PA and lateral chest radiograph in 6-12 weeks after treatment to ensure resolution. Electronically Signed   By: Maurine Simmering M.D.   On: 08/31/2021 08:49    Procedures Procedures (including critical care time)  Medications Ordered in UC Medications - No data to display  Initial Impression / Assessment and Plan / UC Course  I have reviewed the triage vital signs and the nursing notes.  Pertinent labs & imaging results that were available during my care of the patient were reviewed by me and considered in my medical decision making (see chart for details).  Clinical Course as of 08/31/21 0904  Tue Aug 31, 2021  0836 Recheck O2 - 94% [WC]    Clinical Course User Index [WC] [place], Lounette Sloan L, PA    LUL pneumonia - per UTD guidelines, will do dual therapy with amoxicillin 1g TID and doxycycline 100mg  BID x 7 days. Continue mucinex. Increase water intake and rest. F/U with PCP in 8-12 weeks for recheck imaging. RTC precautions reviewed. Out of work x 3 days.  Final Clinical Impressions(s) / UC Diagnoses   Final diagnoses:  Pneumonia of left upper lobe due to infectious organism     Discharge Instructions      Your covid test is negative. You have a pneumonia to the left upper lobe of your lung. We will start dual therapy with antibiotics - doxycycline and amoxicillin. Please take all the antibiotics Call your PCP and schedule a repeat chest xray 8-12 weeks from now. Return to our office or head to the ER if fevers develop, shortness of breath or any new or worsening symptoms. Please remain hydrated and continue taking mucinex.  Do not take it within two hours of milk  consumption, and avoid multivitamins while on the antibiotic. Please avoid excessive sun exposure or tanning beds while taking. Drink plenty of water.       ED Prescriptions     Medication Sig Dispense Auth. Provider   amoxicillin (AMOXIL) 500 MG capsule Take 2 capsules (1,000 mg total) by mouth 3 (three) times daily for 7 days. 42 capsule Radford Pease L, PA   doxycycline (VIBRAMYCIN) 100 MG capsule Take 1 capsule (100 mg total) by mouth 2 (two) times daily for 7 days. 14 capsule Cala Kruckenberg L, PA      PDMP not reviewed this encounter.   Chaney Malling, [place] 08/31/21 [phone]

## 2021-08-31 NOTE — ED Triage Notes (Signed)
Pt c/o cough with congestion and low grade fever of 99-100 for the last 10 days. States he took a home covid test and was negative. He is taking mucinex at home.

## 2021-09-05 ENCOUNTER — Telehealth: Payer: Self-pay | Admitting: Emergency Medicine

## 2021-09-05 NOTE — Telephone Encounter (Signed)
Call back to pt regarding worsening symptoms - SOB & fatigue. Chart reviewed w/ provider here today who saw pt on 08/31/21. Provider directed Howard to go to the ED ASAP. RN advised pt to have someone drive him to the ED. Marian verbalized an understanding & stated he will go to [facility]. Pt coughing through out call

## 2022-09-03 IMAGING — DX DG CHEST 2V
2 series · 2 of 2 positions shown · non-contrast
Comparison: Radiograph 10/29/2017

CLINICAL DATA: 10 day hx of cough, tightness of chest, low grade
fever, hx of pneumonia one year ago, non-smoker. Sx started after
renovating house

EXAM:
CHEST - 2 VIEW

[chest pa]
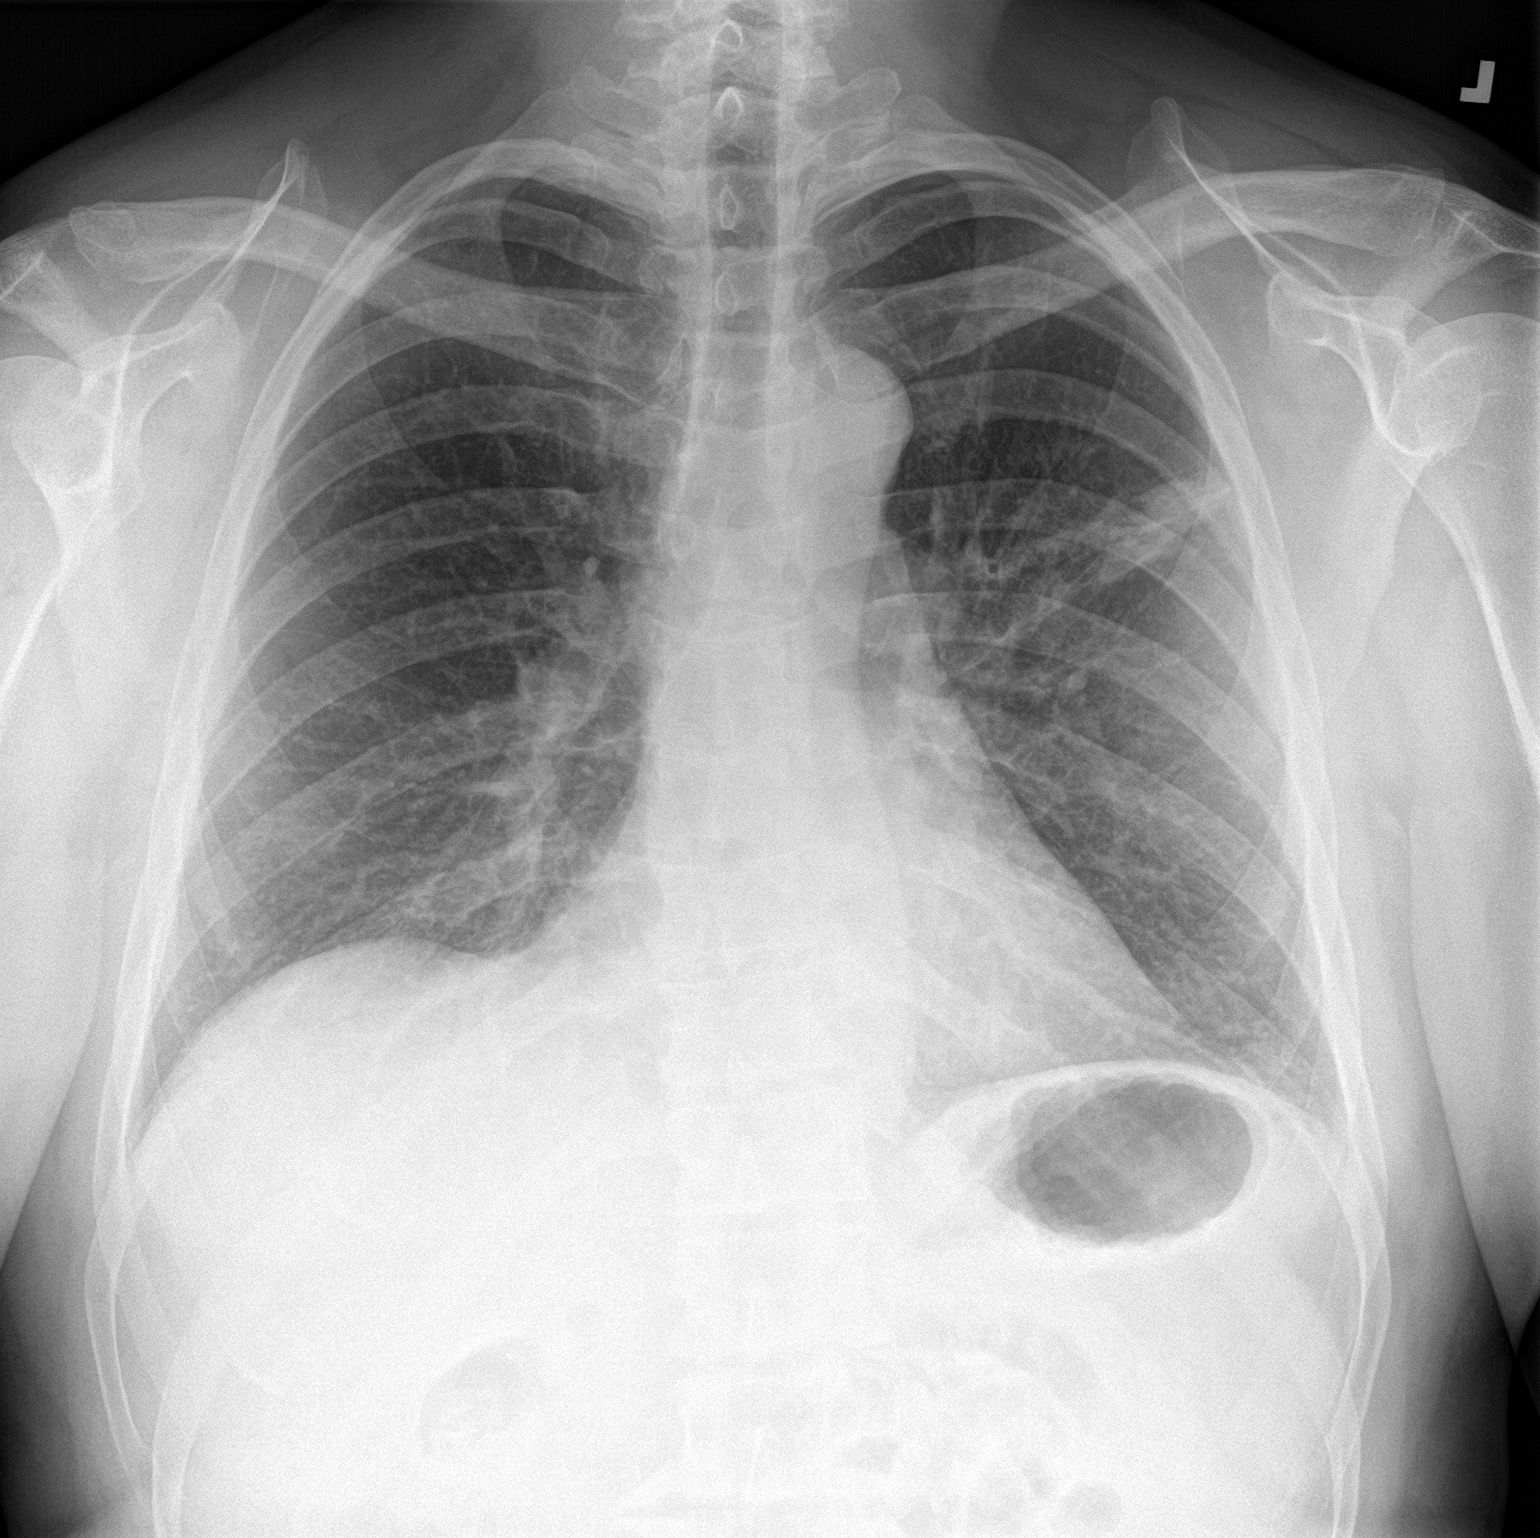

[chest lat]
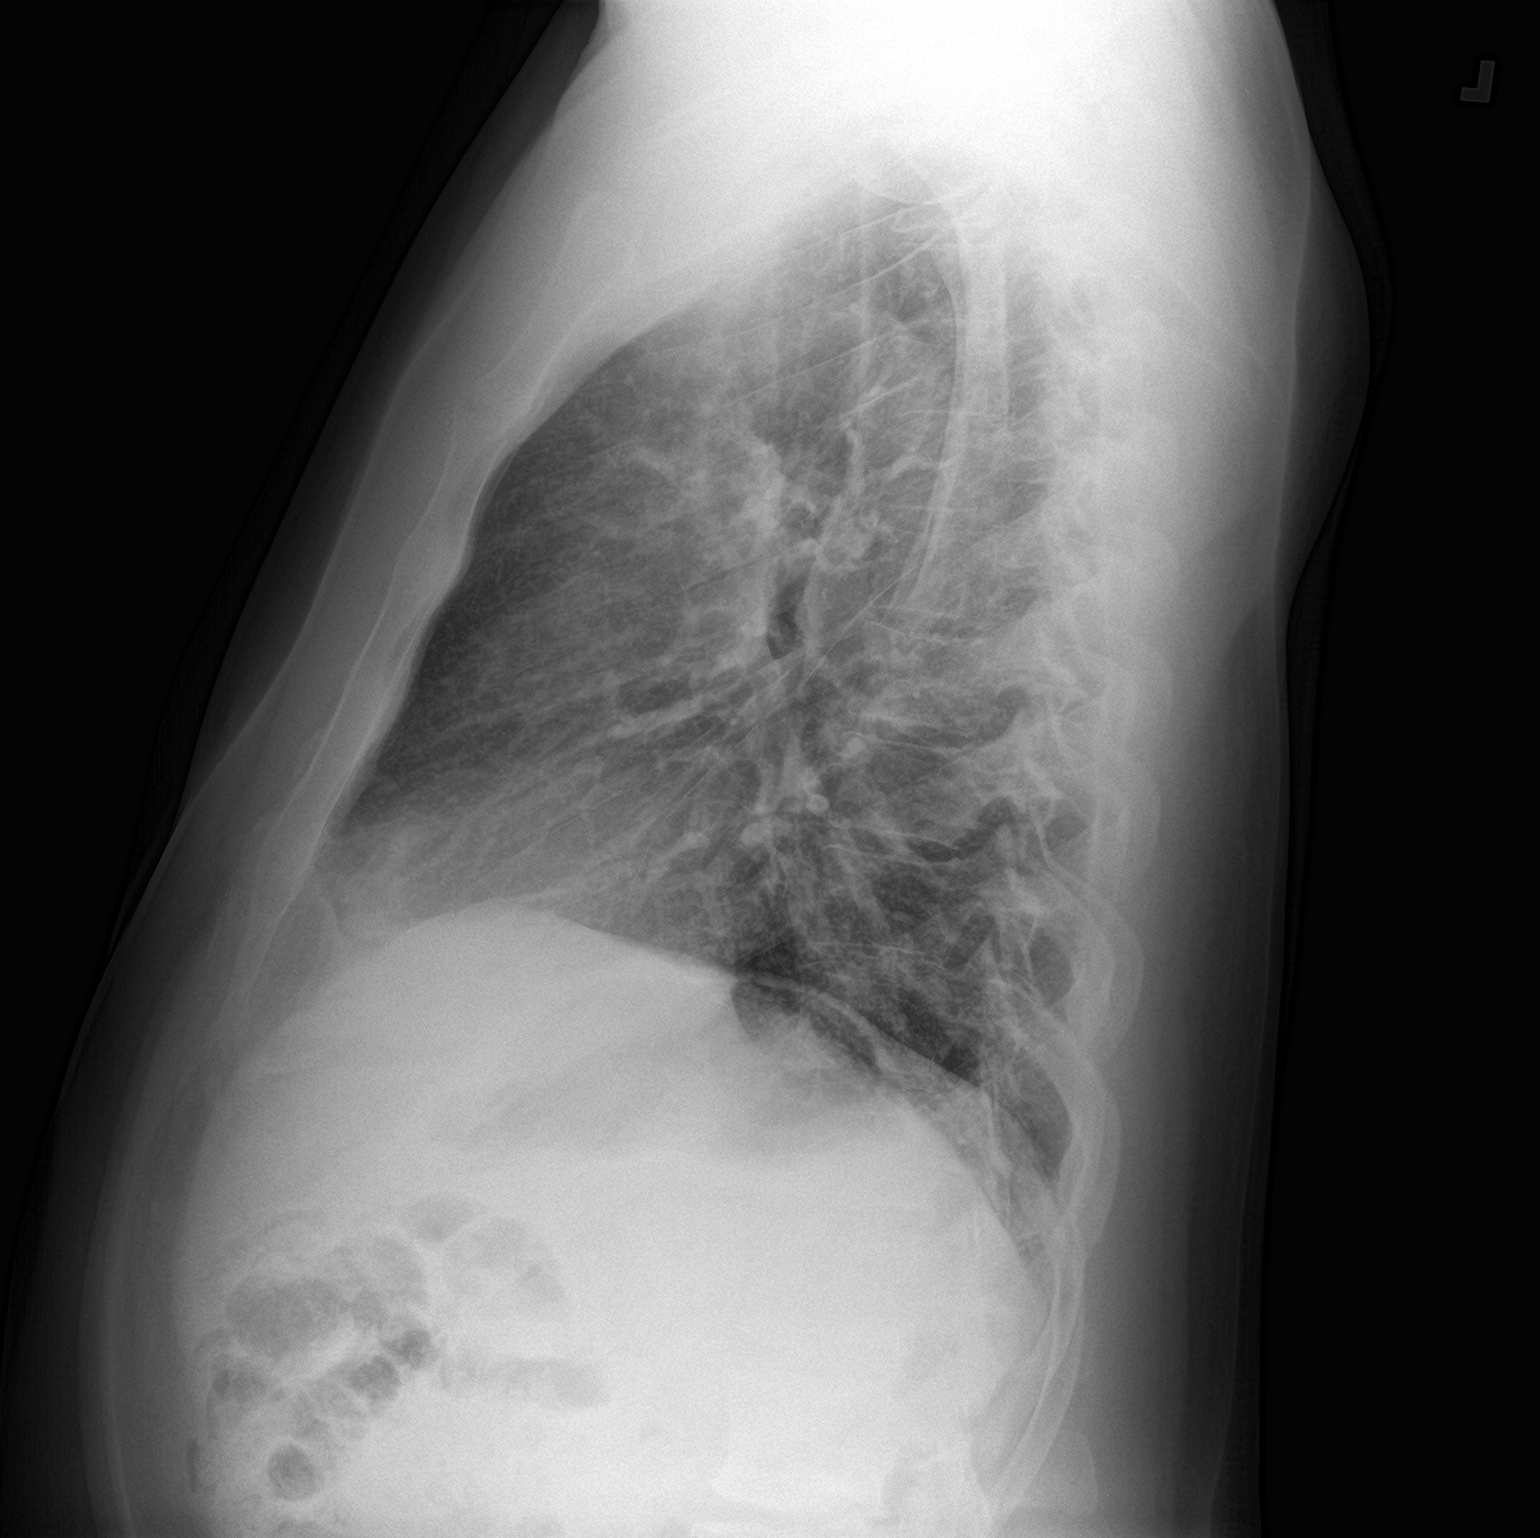

[2 of 2 positions shown; findings below may reference images not displayed]

FINDINGS: Unchanged cardiomediastinal silhouette. There is new airspace
disease in the left upper lung. There is no pleural effusion. No
pneumothorax. There is no acute osseous abnormality.
IMPRESSION: Left upper lung pneumonia. Recommend follow-up PA and lateral chest
radiograph in 6-12 weeks after treatment to ensure resolution.

## 2022-11-29 ENCOUNTER — Ambulatory Visit
Admission: RE | Admit: 2022-11-29 | Discharge: 2022-11-29 | Disposition: A | Discharge status: Home / Self Care | Payer: No Typology Code available for payment source | Source: Ambulatory Visit | Attending: Nurse Practitioner | Admitting: Nurse Practitioner

## 2022-11-29 ENCOUNTER — Other Ambulatory Visit: Payer: Self-pay | Admitting: Nurse Practitioner

## 2022-11-29 DIAGNOSIS — G8929 Other chronic pain: Secondary | ICD-10-CM

## 2023-01-12 ENCOUNTER — Ambulatory Visit: Payer: No Typology Code available for payment source

## 2023-01-12 ENCOUNTER — Ambulatory Visit
Admission: EM | Admit: 2023-01-12 | Discharge: 2023-01-12 | Disposition: A | Discharge status: Home / Self Care | Payer: No Typology Code available for payment source | Attending: Family Medicine | Admitting: Family Medicine

## 2023-01-12 ENCOUNTER — Other Ambulatory Visit: Payer: Self-pay

## 2023-01-12 ENCOUNTER — Encounter: Payer: Self-pay | Admitting: Emergency Medicine

## 2023-01-12 DIAGNOSIS — M25562 Pain in left knee: Secondary | ICD-10-CM

## 2023-01-12 DIAGNOSIS — M25462 Effusion, left knee: Secondary | ICD-10-CM

## 2023-01-12 HISTORY — DX: Pure hypercholesterolemia, unspecified: E78.00

## 2023-01-12 MED ORDER — MELOXICAM 15 MG PO TABS: 15.0000 mg | ORAL_TABLET | Freq: Every day | ORAL | 0 refills | Status: AC

## 2023-01-12 MED ORDER — PREDNISONE 50 MG PO TABS: ORAL_TABLET | ORAL | 0 refills | Status: AC

## 2023-01-12 NOTE — ED Notes (Signed)
Reviewed work note with patient

## 2023-01-12 NOTE — ED Triage Notes (Signed)
Left knee pain started 2 weeks ago.  Patient was playing golf.  The next day, left knee was hurting.  No known injury.  Playing golf is not a common activity.    Has had ibuprofen, has used ice packs.

## 2023-01-12 NOTE — Discharge Instructions (Signed)
Take the prednisone once a day for 5 days in the morning, (instead of ibuprofen) This is a steroid anti-inflammatory, a stronger medicine for the pain and swelling Try to limit prolonged standing or walking for the next few days Wear brace when you are going to be active When you have finished the prednisone take meloxicam once a day.  This is a 24-hour anti-inflammatory (like ibuprofen) Call Dr. Duanne Guess if you fail to improve.  You may need physical therapy, or sports medicine consultation

## 2023-01-12 NOTE — ED Provider Notes (Signed)
[facility]    CSN: [id number] Arrival date & time: 01/12/23  1737      History   Chief Complaint Chief Complaint  Patient presents with   Knee Pain    HPI Ethan Matthews is a 52 y.o. male.   HPI  Patient states he is usually physically active 7 days a week and does not have problems with joint pain.  He states that he went to a golf putting place and he and his family hit golf balls for about 2 hours straight.  He states it involves a lot of twisting.  When he was done he noticed a little soreness in his left knee.  This is 2 weeks ago, and his knee is continue to be painful.  It hurts going up and down stairs.  Hurts when he first gets up in the morning.  It hurts if he sits for a few minutes and then stands.  He states he has not heard any popping or grinding but he has felt like it might buckle.  He did not have any trauma.  No prior knee problems  Past Medical History:  Diagnosis Date   High cholesterol     Patient Active Problem List   Diagnosis Date Noted   Abscess and cellulitis 08/15/2014   Cellulitis and abscess of leg 08/15/2014   Abscess 08/15/2014    History reviewed. No pertinent surgical history.     Home Medications    Prior to Admission medications   Medication Sig Start Date End Date Taking? Authorizing Provider  meloxicam (MOBIC) 15 MG tablet Take 1 tablet (15 mg total) by mouth daily. 01/12/23  Yes Eustace Moore, MD  predniSONE (DELTASONE) 50 MG tablet Take once a day for 5 days.  Take with food 01/12/23  Yes Eustace Moore, MD  GuanFACINE HCl 3 MG TB24 Take 1 tablet by mouth daily.    [provider]  ibuprofen (ADVIL,MOTRIN) 200 MG tablet Take 400 mg by mouth every 6 (six) hours as needed for moderate pain.    [provider]  Multiple Vitamins-Minerals (MULTIVITAMIN MEN PO) Take 1 tablet by mouth daily.    [provider]  simvastatin (ZOCOR) 5 MG tablet Take 5 mg by mouth at bedtime. 08/27/21    [provider]  venlafaxine XR (EFFEXOR-XR) 37.5 MG 24 hr capsule Take by mouth. 05/12/16   [provider]    Family History Family History  Problem Relation Age of Onset   Diabetes type II Father     Social History Social History   Tobacco Use   Smoking status: Never   Smokeless tobacco: Never  Vaping Use   Vaping status: Never Used  Substance Use Topics   Alcohol use: Yes   Drug use: No     Allergies   Patient has no known allergies.   Review of Systems Review of Systems See HPI  Physical Exam Triage Vital Signs ED Triage Vitals  Encounter Vitals Group     BP 01/12/23 1751 117/78     Systolic BP Percentile --      Diastolic BP Percentile --      Pulse Rate 01/12/23 1751 80     Resp 01/12/23 1751 20     Temp 01/12/23 1751 98.3 F (36.8 C)     Temp Source 01/12/23 1751 Oral     SpO2 01/12/23 1751 95 %     Weight --      Height --  Head Circumference --      Peak Flow --      Pain Score 01/12/23 1747 6     Pain Loc --      Pain Education --      Exclude from Growth Chart --    No data found.  Updated Vital Signs BP 117/78 (BP Location: Left Arm)   Pulse 80   Temp 98.3 F (36.8 C) (Oral)   Resp 20   SpO2 95%   Physical Exam Constitutional:      General: He is not in acute distress.    Appearance: He is well-developed.  HENT:     Head: Normocephalic and atraumatic.  Eyes:     Conjunctiva/sclera: Conjunctivae normal.     Pupils: Pupils are equal, round, and reactive to light.  Cardiovascular:     Rate and Rhythm: Normal rate.  Pulmonary:     Effort: Pulmonary effort is normal. No respiratory distress.  Abdominal:     General: There is no distension.     Palpations: Abdomen is soft.  Musculoskeletal:        General: Tenderness present. No deformity or signs of injury. Normal range of motion.     Cervical back: Normal range of motion.     Comments: Both knees are examined.  The left has some slight swelling.  Trace  effusion.  There is definite patellofemoral crepitus and pain with patellofemoral grind testing.  There is also tenderness in the lateral joint line that is mild.  Full range of motion.  No instability.  Skin:    General: Skin is warm and dry.  Neurological:     Mental Status: He is alert.      UC Treatments / Results  Labs (all labs ordered are listed, but only abnormal results are displayed) Labs Reviewed - No data to display  EKG   Radiology No results found.  Procedures Procedures (including critical care time)  Medications Ordered in UC Medications - No data to display  Initial Impression / Assessment and Plan / UC Course  I have reviewed the triage vital signs and the nursing notes.  Pertinent labs & imaging results that were available during my care of the patient were reviewed by me and considered in my medical decision making (see chart for details).     X-rays are negative Final Clinical Impressions(s) / UC Diagnoses   Final diagnoses:  Anterior knee pain, left  Effusion, left knee     Discharge Instructions      Take the prednisone once a day for 5 days in the morning, (instead of ibuprofen) This is a steroid anti-inflammatory, a stronger medicine for the pain and swelling Try to limit prolonged standing or walking for the next few days Wear brace when you are going to be active When you have finished the prednisone take meloxicam once a day.  This is a 24-hour anti-inflammatory (like ibuprofen) Call Dr. Duanne Guess if you fail to improve.  You may need physical therapy, or sports medicine consultation     ED Prescriptions     Medication Sig Dispense Auth. Provider   predniSONE (DELTASONE) 50 MG tablet Take once a day for 5 days.  Take with food 5 tablet Eustace Moore, MD   meloxicam (MOBIC) 15 MG tablet Take 1 tablet (15 mg total) by mouth daily. 15 tablet Eustace Moore, MD      PDMP not reviewed this encounter.   Eustace Moore,  MD 01/12/23 Ethan Matthews
